# Patient Record
Sex: Female | Born: 1970
Health system: Southern US, Community
[De-identification: ages and names within clinical notes are randomized; demographics above are authoritative.]

## PROBLEM LIST (undated history)

## (undated) DIAGNOSIS — E119 Type 2 diabetes mellitus without complications: Secondary | ICD-10-CM

## (undated) DIAGNOSIS — I10 Essential (primary) hypertension: Secondary | ICD-10-CM

## (undated) DIAGNOSIS — F419 Anxiety disorder, unspecified: Secondary | ICD-10-CM

## (undated) DIAGNOSIS — J3089 Other allergic rhinitis: Secondary | ICD-10-CM

## (undated) HISTORY — PX: ENDOMETRIAL ABLATION: SHX621

## (undated) HISTORY — PX: TUBAL LIGATION: SHX77

---

## 1998-07-13 ENCOUNTER — Inpatient Hospital Stay (HOSPITAL_COMMUNITY): Admission: AD | Admit: 1998-07-13 | Discharge: 1998-07-13 | Payer: Self-pay | Admitting: Obstetrics & Gynecology

## 1998-07-15 ENCOUNTER — Inpatient Hospital Stay (HOSPITAL_COMMUNITY): Admission: AD | Admit: 1998-07-15 | Discharge: 1998-07-15 | Payer: Self-pay | Admitting: Obstetrics

## 1998-07-22 ENCOUNTER — Ambulatory Visit (HOSPITAL_COMMUNITY): Admission: RE | Admit: 1998-07-22 | Discharge: 1998-07-22 | Payer: Self-pay | Admitting: Obstetrics

## 1998-07-31 ENCOUNTER — Ambulatory Visit (HOSPITAL_COMMUNITY): Admission: RE | Admit: 1998-07-31 | Discharge: 1998-07-31 | Payer: Self-pay | Admitting: Obstetrics

## 1998-08-13 ENCOUNTER — Other Ambulatory Visit: Admission: RE | Admit: 1998-08-13 | Discharge: 1998-08-13 | Payer: Self-pay | Admitting: Obstetrics & Gynecology

## 1998-11-16 ENCOUNTER — Other Ambulatory Visit: Admission: RE | Admit: 1998-11-16 | Discharge: 1998-11-16 | Payer: Self-pay | Admitting: Obstetrics & Gynecology

## 1998-12-21 ENCOUNTER — Emergency Department (HOSPITAL_COMMUNITY): Admission: EM | Admit: 1998-12-21 | Discharge: 1998-12-21 | Payer: Self-pay | Admitting: Emergency Medicine

## 1998-12-21 ENCOUNTER — Encounter: Payer: Self-pay | Admitting: Emergency Medicine

## 1999-01-15 ENCOUNTER — Other Ambulatory Visit: Admission: RE | Admit: 1999-01-15 | Discharge: 1999-01-15 | Payer: Self-pay | Admitting: *Deleted

## 1999-03-03 ENCOUNTER — Ambulatory Visit (HOSPITAL_COMMUNITY): Admission: RE | Admit: 1999-03-03 | Discharge: 1999-03-03 | Payer: Self-pay | Admitting: *Deleted

## 2000-05-01 ENCOUNTER — Encounter: Admission: RE | Admit: 2000-05-01 | Discharge: 2000-05-26 | Payer: Self-pay | Admitting: *Deleted

## 2003-09-19 ENCOUNTER — Ambulatory Visit (HOSPITAL_COMMUNITY): Admission: RE | Admit: 2003-09-19 | Discharge: 2003-09-19 | Payer: Self-pay | Admitting: *Deleted

## 2004-10-22 ENCOUNTER — Emergency Department (HOSPITAL_COMMUNITY): Admission: EM | Admit: 2004-10-22 | Discharge: 2004-10-22 | Payer: Self-pay | Admitting: Family Medicine

## 2007-07-15 ENCOUNTER — Emergency Department (HOSPITAL_COMMUNITY): Admission: EM | Admit: 2007-07-15 | Discharge: 2007-07-15 | Payer: Self-pay | Admitting: Emergency Medicine

## 2007-08-15 ENCOUNTER — Emergency Department (HOSPITAL_COMMUNITY): Admission: EM | Admit: 2007-08-15 | Discharge: 2007-08-15 | Payer: Self-pay | Admitting: Family Medicine

## 2007-09-10 ENCOUNTER — Emergency Department (HOSPITAL_COMMUNITY): Admission: EM | Admit: 2007-09-10 | Discharge: 2007-09-10 | Payer: Self-pay | Admitting: Family Medicine

## 2007-10-30 ENCOUNTER — Encounter: Admission: RE | Admit: 2007-10-30 | Discharge: 2007-10-30 | Payer: Self-pay | Admitting: Specialist

## 2008-07-14 ENCOUNTER — Encounter: Admission: RE | Admit: 2008-07-14 | Discharge: 2008-07-14 | Payer: Self-pay | Admitting: Obstetrics and Gynecology

## 2010-06-17 ENCOUNTER — Encounter: Admission: RE | Admit: 2010-06-17 | Discharge: 2010-06-17 | Payer: Self-pay | Admitting: Family Medicine

## 2010-12-17 NOTE — Op Note (Signed)
Tracey Reed, Tracey Reed                      ACCOUNT NO.:  [id number]   MEDICAL RECORD NO.:  [id number]                   PATIENT TYPE:  AMB   LOCATION:  SDC                                  FACILITY:  [facility]   PHYSICIAN:  Tracie Harrier, M.D.              DATE OF BIRTH:  06-26-1971   DATE OF PROCEDURE:  09/19/2003  DATE OF DISCHARGE:                                 OPERATIVE REPORT   PREOPERATIVE DIAGNOSIS:  Abnormal uterine bleeding.   POSTOPERATIVE DIAGNOSIS:  Abnormal uterine bleeding.   PROCEDURE:  Endometrial ablation by Novasure device.   SURGEON:  Tracie Harrier, M.D.   ANESTHESIA:  General.   ESTIMATED BLOOD LOSS:  Less than 20 mL.   COMPLICATIONS:  None.   DESCRIPTION OF PROCEDURE:  The patient was taken to the operating room,  where general anesthetic was administered.  The patient was placed on the  operating table in the dorsal lithotomy position.  The perineum and vagina  were prepped and draped in the usual sterile fashion with Betadine and  sterile drapes.  A red rubber catheter was used to empty the bladder.  A  speculum was placed and the cervix was grasped with a single-tooth  tenaculum.  A paracervical block was then placed in the 3 and 9 o'clock  positions with 1% lidocaine.  Next, the cervix was serially dilated until a  #25 Pratt dilator fit easily into the anterior cervical os.  Next the  Novasure device was introduced into the endometrial cavity and a 59-second  burn was undercarried per company specifications.  All vaginal instruments  were then removed.  The patient tolerated this procedure well.  There were  no perioperative complications.  The patient was then awakened and taken to  the recovery room in good condition.                                               Tracie Harrier, M.D.    REG/MEDQ  D:  09/19/2003  T:  09/20/2003  Job:  [id number]

## 2010-12-17 NOTE — H&P (Signed)
NAME:  Tracey Reed, Tracey Reed                      ACCOUNT NO.:  [id number]   MEDICAL RECORD NO.:  [id number]                   PATIENT TYPE:  AMB   LOCATION:  SDC                                  FACILITY:  [facility]   PHYSICIAN:  Tracie Harrier, M.D.              DATE OF BIRTH:  September 24, 1970   DATE OF ADMISSION:  DATE OF DISCHARGE:                                HISTORY & PHYSICAL   HISTORY OF PRESENT ILLNESS:  Ms. Tracey Reed is a 40 year old female gravida 1  para 1 status post laparoscopic bilateral tubal ligation in the past.  The  patient is having irregular uterine bleeding which is quite worrisome to  her.  She has undergone evaluation for this.  Ultrasound showed only one  small intramural fibroid.  Different treatment options were reviewed with  her including conservative medical management versus aggressive surgical  treatment.  She has requested endometrial ablation.  She also questioned  whether to proceed with vaginal hysterectomy.  We will attempt endometrial  ablation to see if this regulates her bleeding satisfactorily.   MEDICAL HISTORY:  1. History of chlamydia - remote.  2. History of cervical dysplasia.   SURGICAL HISTORY:  1. In 1994 - LEEP procedure.  2. Laparoscopic bilateral tubal ligation - 2000.   OBSTETRICAL HISTORY:  Normal spontaneous vaginal delivery x1 at term.   CURRENT MEDICATIONS:  None.   ALLERGIES:  None known.   PHYSICAL EXAMINATION:  VITAL SIGNS:  Stable, blood pressure 100/60.  GENERAL:  She is a well-developed, well-nourished female in no acute  distress.  HEENT:  Within normal limits.  NECK:  Supple without adenopathy or thyromegaly.  HEART:  Regular rate and rhythm without murmur, gallop, or rub.  LUNGS:  Clear to auscultation.  BREAST:  Exam done recently in the office was normal.  This is deferred upon  admission.  ABDOMEN:  Soft and benign without masses, tenderness, hernia, or  organomegaly.  EXTREMITIES AND NEUROLOGIC:  Grossly normal.  PELVIC:  Normal external female genitalia.  Vagina and cervix clear.  The  uterus is small, nontender, mobile, the adnexa clear.   ADMITTING DIAGNOSES:  1. Abnormal uterine bleeding.  2. Small intramural fibroid.  3. Status post bilateral tubal ligation.   PLAN:  NovaSure endometrial ablation.   DISCUSSION:  The risks and benefits of this procedure discussed with the  patient.  The risks of bleeding, infection, risk of injury to surrounding  organs was reviewed.  Also, various alternative therapies reviewed with the  patient at length.                                               Tracie Harrier, M.D.    REG/MEDQ  D:  09/18/2003  T:  09/18/2003  Job:  [id number]

## 2011-11-01 ENCOUNTER — Ambulatory Visit (INDEPENDENT_AMBULATORY_CARE_PROVIDER_SITE_OTHER): Payer: 59 | Admitting: Family Medicine

## 2011-11-01 VITALS — BP 134/82 | HR 69 | Temp 98.9°F | Resp 18 | Ht 64.5 in | Wt 284.0 lb

## 2011-11-01 DIAGNOSIS — F419 Anxiety disorder, unspecified: Secondary | ICD-10-CM

## 2011-11-01 DIAGNOSIS — F411 Generalized anxiety disorder: Secondary | ICD-10-CM

## 2011-11-01 DIAGNOSIS — M62838 Other muscle spasm: Secondary | ICD-10-CM

## 2011-11-01 DIAGNOSIS — I10 Essential (primary) hypertension: Secondary | ICD-10-CM

## 2011-11-01 LAB — COMPREHENSIVE METABOLIC PANEL
ALT: 18 U/L (ref 0–35)
AST: 25 U/L (ref 0–37)
Albumin: 4 g/dL (ref 3.5–5.2)
Alkaline Phosphatase: 56 U/L (ref 39–117)
BUN: 11 mg/dL (ref 6–23)
CO2: 23 mEq/L (ref 19–32)
Calcium: 9.7 mg/dL (ref 8.4–10.5)
Chloride: 102 mEq/L (ref 96–112)
Creat: 0.85 mg/dL (ref 0.50–1.10)
Glucose, Bld: 128 mg/dL — ABNORMAL HIGH (ref 70–99)
Potassium: 3.7 mEq/L (ref 3.5–5.3)
Sodium: 138 mEq/L (ref 135–145)
Total Bilirubin: 0.4 mg/dL (ref 0.3–1.2)
Total Protein: 7.6 g/dL (ref 6.0–8.3)

## 2011-11-01 LAB — POCT CBC
Granulocyte percent: 51.9 %G (ref 37–80)
HCT, POC: 39.7 % (ref 37.7–47.9)
Hemoglobin: 13.4 g/dL (ref 12.2–16.2)
Lymph, poc: 5.1 — AB (ref 0.6–3.4)
MCH, POC: 27.3 pg (ref 27–31.2)
MCHC: 33.8 g/dL (ref 31.8–35.4)
MCV: 81.1 fL (ref 80–97)
MID (cbc): 0.5 (ref 0–0.9)
MPV: 7.8 fL (ref 0–99.8)
POC Granulocyte: 6.1 (ref 2–6.9)
POC LYMPH PERCENT: 43.5 %L (ref 10–50)
POC MID %: 4.5 %M (ref 0–12)
Platelet Count, POC: 485 10*3/uL — AB (ref 142–424)
RBC: 4.9 M/uL (ref 4.04–5.48)
RDW, POC: 16.3 %
WBC: 11.7 10*3/uL — AB (ref 4.6–10.2)

## 2011-11-01 MED ORDER — CYCLOBENZAPRINE HCL 10 MG PO TABS: 10.0000 mg | ORAL_TABLET | Freq: Two times a day (BID) | ORAL | Status: DC | PRN

## 2011-11-01 MED ORDER — CLONAZEPAM 0.5 MG PO TABS: 0.5000 mg | ORAL_TABLET | Freq: Two times a day (BID) | ORAL | Status: DC | PRN

## 2011-11-01 MED ORDER — ATENOLOL 25 MG PO TABS: 25.0000 mg | ORAL_TABLET | Freq: Every day | ORAL | Status: DC

## 2011-11-01 NOTE — Progress Notes (Signed)
  Patient Name: Tracey Reed Date of Birth: 09-07-1970 Medical Record Number: [id number] Gender: female Date of Encounter: 11/01/2011  History of Present Illness:  Tracey Reed is a 40 y.o. very pleasant female patient who presents with the following:  Here today for medication refill. She uses klonopin 0.5 mg, usually #2 about 3 times weekly.  She does feel that her anxiety may be getting a little bit worse.  She has some panic symptoms as well.  Also uses flexeril rarely for muscle spasm.   She is not exercising but knows that she should start.  She also needs RF of her atenolol  History of BTL  There is no problem list on file for this patient.  No past medical history on file. No past surgical history on file. History  Substance Use Topics  . Smoking status: Never Smoker   . Smokeless tobacco: Not on file  . Alcohol Use: Not on file   No family history on file. No Known Allergies  Medication list has been reviewed and updated.  Review of Systems: As per HPI- otherwise negative.   Physical Examination: Filed Vitals:   11/01/11 1218  BP: 134/82  Pulse: 69  Temp: 98.9 F (37.2 C)  TempSrc: Oral  Resp: 18  Height: 5' 4.5" (1.638 m)  Weight: 284 lb (128.822 kg)    Body mass index is 48.00 kg/(m^2).  GEN: WDWN, NAD, Non-toxic, A & O x 3, obese HEENT: Atraumatic, Normocephalic. Neck supple. No masses, No LAD. Ears and Nose: No external deformity. CV: RRR, No M/G/R. No JVD. No thrill. No extra heart sounds. PULM: CTA B, no wheezes, crackles, rhonchi. No retractions. No resp. distress. No accessory muscle use EXTR: No c/c/e NEURO Normal gait.  PSYCH: Normally interactive. Conversant. Not depressed or anxious appearing.  Calm demeanor.    Assessment and Plan: 1. Anxiety  clonazePAM (KLONOPIN) 0.5 MG tablet, TSH  2. Hypertension  atenolol (TENORMIN) 25 MG tablet, POCT CBC, Comprehensive metabolic panel, TSH  3. Muscle spasm  cyclobenzaprine  (FLEXERIL) 10 MG tablet   Refilled medications as above.  See pt instructions for more details.  She will let us know if she is worse- otherwise further follow- up pending labs

## 2011-11-01 NOTE — Patient Instructions (Signed)
Work on exercising- try a 30 minute walk a few times a week.  Also, consider seeing a counselor for your anxiety.  Let us know if this is getting worse or not improving.

## 2011-11-02 ENCOUNTER — Encounter: Payer: Self-pay | Admitting: Family Medicine

## 2011-11-02 LAB — TSH: TSH: 0.606 u[IU]/mL (ref 0.350–4.500)

## 2011-11-15 ENCOUNTER — Other Ambulatory Visit: Payer: Self-pay | Admitting: Family Medicine

## 2011-11-15 DIAGNOSIS — Z1231 Encounter for screening mammogram for malignant neoplasm of breast: Secondary | ICD-10-CM

## 2011-11-16 ENCOUNTER — Ambulatory Visit
Admission: RE | Admit: 2011-11-16 | Discharge: 2011-11-16 | Disposition: A | Discharge status: Home / Self Care | Payer: BC Managed Care – PPO | Source: Ambulatory Visit | Attending: Family Medicine | Admitting: Family Medicine

## 2011-11-16 ENCOUNTER — Telehealth: Payer: Self-pay

## 2011-11-16 DIAGNOSIS — Z1231 Encounter for screening mammogram for malignant neoplasm of breast: Secondary | ICD-10-CM

## 2011-11-16 NOTE — Telephone Encounter (Signed)
Pt calling to make Dr Milus Glazier aware that she had her breast exam done at the breast center and they will be sending results to him, pt would like for him to call her once he as examined the results.

## 2012-01-02 ENCOUNTER — Other Ambulatory Visit: Payer: Self-pay | Admitting: Family Medicine

## 2012-01-03 ENCOUNTER — Other Ambulatory Visit: Payer: Self-pay | Admitting: *Deleted

## 2012-01-03 DIAGNOSIS — F419 Anxiety disorder, unspecified: Secondary | ICD-10-CM

## 2012-01-03 MED ORDER — CLONAZEPAM 0.5 MG PO TABS: 0.5000 mg | ORAL_TABLET | Freq: Two times a day (BID) | ORAL | Status: DC | PRN

## 2012-01-03 MED ORDER — OMEPRAZOLE 20 MG PO CPDR: 20.0000 mg | DELAYED_RELEASE_CAPSULE | Freq: Every day | ORAL | Status: DC

## 2012-02-04 ENCOUNTER — Other Ambulatory Visit: Payer: Self-pay | Admitting: Physician Assistant

## 2012-02-05 ENCOUNTER — Other Ambulatory Visit: Payer: Self-pay | Admitting: Physician Assistant

## 2012-02-05 DIAGNOSIS — F419 Anxiety disorder, unspecified: Secondary | ICD-10-CM

## 2012-02-05 MED ORDER — CLONAZEPAM 0.5 MG PO TABS: 0.5000 mg | ORAL_TABLET | Freq: Two times a day (BID) | ORAL | Status: DC | PRN

## 2012-02-07 ENCOUNTER — Other Ambulatory Visit: Payer: Self-pay | Admitting: Physician Assistant

## 2012-02-14 ENCOUNTER — Telehealth: Payer: Self-pay

## 2012-02-14 NOTE — Telephone Encounter (Signed)
Pt would like to see if Dr can do a constant refill on her clonazapam since she takes it on a regular basis since she has to call it in to pharmacy every month [phone]

## 2012-02-15 NOTE — Telephone Encounter (Signed)
Is this something that can be done?

## 2012-02-15 NOTE — Telephone Encounter (Signed)
LMOM to call back

## 2012-02-15 NOTE — Telephone Encounter (Signed)
Likely this is not the case sine we PA's do the refills and we cannot write for refills of it.

## 2012-02-19 NOTE — Telephone Encounter (Signed)
Patient notified

## 2012-02-29 ENCOUNTER — Telehealth: Payer: Self-pay

## 2012-02-29 DIAGNOSIS — F419 Anxiety disorder, unspecified: Secondary | ICD-10-CM

## 2012-02-29 NOTE — Telephone Encounter (Signed)
If patient has already picked up prescription, ok to call in an addition #30 to equal a total of 1 month supply. If she has not picked it up, may change to #60.

## 2012-02-29 NOTE — Telephone Encounter (Signed)
The patient called to request change in her Clonazepam Rx.  She stated that the orginial rx was written for 2 x daily totaling 60 pills and then Kennedy Bucker PAC changed it to a 15 day supply of 30 pills and she was wondering why she changed the rx from a 30 day supply to a 15 day supply.  Please call the patient at [phone].

## 2012-02-29 NOTE — Telephone Encounter (Signed)
It looks like Tracey Reed might have written RF for #30 instead of #60 in error on 02/05/12 because there is no note on RF stating pt needs OV. Can we RF for 30 days now (#60)?

## 2012-03-01 MED ORDER — CLONAZEPAM 0.5 MG PO TABS: 0.5000 mg | ORAL_TABLET | Freq: Two times a day (BID) | ORAL | Status: DC | PRN

## 2012-03-01 NOTE — Telephone Encounter (Signed)
Called in the rest of her Rx and called her to advise

## 2012-03-17 ENCOUNTER — Other Ambulatory Visit: Payer: Self-pay | Admitting: Physician Assistant

## 2012-03-22 ENCOUNTER — Other Ambulatory Visit: Payer: Self-pay | Admitting: Family Medicine

## 2012-03-22 DIAGNOSIS — F419 Anxiety disorder, unspecified: Secondary | ICD-10-CM

## 2012-03-22 MED ORDER — CLONAZEPAM 0.5 MG PO TABS: 0.5000 mg | ORAL_TABLET | Freq: Two times a day (BID) | ORAL | Status: DC | PRN

## 2012-04-10 ENCOUNTER — Other Ambulatory Visit: Payer: Self-pay | Admitting: Family Medicine

## 2012-04-10 DIAGNOSIS — F419 Anxiety disorder, unspecified: Secondary | ICD-10-CM

## 2012-04-10 MED ORDER — CLONAZEPAM 0.5 MG PO TABS: 0.5000 mg | ORAL_TABLET | Freq: Two times a day (BID) | ORAL | Status: DC | PRN

## 2012-05-05 ENCOUNTER — Ambulatory Visit (INDEPENDENT_AMBULATORY_CARE_PROVIDER_SITE_OTHER): Payer: 59 | Admitting: Family Medicine

## 2012-05-05 VITALS — BP 136/89 | HR 59 | Temp 97.9°F | Resp 18 | Ht 65.5 in | Wt 263.8 lb

## 2012-05-05 DIAGNOSIS — F419 Anxiety disorder, unspecified: Secondary | ICD-10-CM

## 2012-05-05 DIAGNOSIS — K219 Gastro-esophageal reflux disease without esophagitis: Secondary | ICD-10-CM

## 2012-05-05 DIAGNOSIS — F411 Generalized anxiety disorder: Secondary | ICD-10-CM

## 2012-05-05 MED ORDER — CLONAZEPAM 1 MG PO TABS: 1.0000 mg | ORAL_TABLET | Freq: Every day | ORAL | Status: DC | PRN

## 2012-05-05 MED ORDER — OMEPRAZOLE 20 MG PO CPDR: 20.0000 mg | DELAYED_RELEASE_CAPSULE | Freq: Every day | ORAL | Status: DC

## 2012-05-05 NOTE — Progress Notes (Signed)
41 yo woman with chronic anxiety and epigastric distress (chronic with negative GI workup) and some extremity paresthesias  Objective:  NAD Chest:  Clear Heart: reg, no murmur Abdomen:  Soft, nontender, no masses  Assessment:  Mild hypokalemia, chronic IBS, anxiety not well controlled   1. Anxiety  clonazePAM (KLONOPIN) 1 MG tablet  2. GERD (gastroesophageal reflux disease)  omeprazole (PRILOSEC) 20 MG capsule   Probiotics.  Cut HCTZ in half.  Call if paresthesias persist.

## 2012-05-06 ENCOUNTER — Telehealth: Payer: Self-pay

## 2012-05-06 NOTE — Telephone Encounter (Signed)
p 

## 2012-07-02 ENCOUNTER — Other Ambulatory Visit: Payer: Self-pay | Admitting: *Deleted

## 2012-07-02 ENCOUNTER — Telehealth: Payer: Self-pay | Admitting: *Deleted

## 2012-07-02 MED ORDER — HYDROCHLOROTHIAZIDE 25 MG PO TABS: 25.0000 mg | ORAL_TABLET | Freq: Every day | ORAL | Status: DC

## 2012-08-07 ENCOUNTER — Other Ambulatory Visit: Payer: Self-pay | Admitting: Physician Assistant

## 2012-09-27 ENCOUNTER — Other Ambulatory Visit: Payer: Self-pay | Admitting: Physician Assistant

## 2012-11-02 ENCOUNTER — Encounter: Payer: Self-pay | Admitting: Family Medicine

## 2012-11-02 ENCOUNTER — Ambulatory Visit (INDEPENDENT_AMBULATORY_CARE_PROVIDER_SITE_OTHER): Payer: 59 | Admitting: Family Medicine

## 2012-11-02 VITALS — BP 132/60 | HR 50 | Temp 98.1°F | Resp 16 | Ht 65.25 in | Wt 261.2 lb

## 2012-11-02 DIAGNOSIS — F419 Anxiety disorder, unspecified: Secondary | ICD-10-CM

## 2012-11-02 DIAGNOSIS — I10 Essential (primary) hypertension: Secondary | ICD-10-CM | POA: Insufficient documentation

## 2012-11-02 DIAGNOSIS — R7309 Other abnormal glucose: Secondary | ICD-10-CM

## 2012-11-02 DIAGNOSIS — F411 Generalized anxiety disorder: Secondary | ICD-10-CM | POA: Insufficient documentation

## 2012-11-02 LAB — COMPREHENSIVE METABOLIC PANEL
ALT: 25 U/L (ref 0–35)
AST: 24 U/L (ref 0–37)
Albumin: 4.2 g/dL (ref 3.5–5.2)
Alkaline Phosphatase: 55 U/L (ref 39–117)
BUN: 15 mg/dL (ref 6–23)
CO2: 30 mEq/L (ref 19–32)
Calcium: 9.7 mg/dL (ref 8.4–10.5)
Chloride: 100 mEq/L (ref 96–112)
Creat: 1.02 mg/dL (ref 0.50–1.10)
Glucose, Bld: 93 mg/dL (ref 70–99)
Potassium: 3.9 mEq/L (ref 3.5–5.3)
Sodium: 138 mEq/L (ref 135–145)
Total Bilirubin: 0.4 mg/dL (ref 0.3–1.2)
Total Protein: 7.8 g/dL (ref 6.0–8.3)

## 2012-11-02 LAB — TSH: TSH: 0.716 u[IU]/mL (ref 0.350–4.500)

## 2012-11-02 LAB — POCT CBC
Granulocyte percent: 42.8 %G (ref 37–80)
HCT, POC: 41.1 % (ref 37.7–47.9)
Hemoglobin: 13.2 g/dL (ref 12.2–16.2)
Lymph, poc: 4.1 — AB (ref 0.6–3.4)
MCH, POC: 27 pg (ref 27–31.2)
MCHC: 32.1 g/dL (ref 31.8–35.4)
MCV: 84.1 fL (ref 80–97)
MID (cbc): 0.5 (ref 0–0.9)
MPV: 7.7 fL (ref 0–99.8)
POC Granulocyte: 3.5 (ref 2–6.9)
POC LYMPH PERCENT: 50.6 %L — AB (ref 10–50)
POC MID %: 6.6 %M (ref 0–12)
Platelet Count, POC: 482 10*3/uL — AB (ref 142–424)
RBC: 4.89 M/uL (ref 4.04–5.48)
RDW, POC: 15.7 %
WBC: 8.1 10*3/uL (ref 4.6–10.2)

## 2012-11-02 LAB — POCT GLYCOSYLATED HEMOGLOBIN (HGB A1C): Hemoglobin A1C: 6.2

## 2012-11-02 MED ORDER — HYDROCHLOROTHIAZIDE 25 MG PO TABS: ORAL_TABLET | ORAL | Status: DC

## 2012-11-02 MED ORDER — ATENOLOL 25 MG PO TABS: 25.0000 mg | ORAL_TABLET | Freq: Every day | ORAL | Status: DC

## 2012-11-02 MED ORDER — CLONAZEPAM 1 MG PO TABS: 1.0000 mg | ORAL_TABLET | Freq: Every day | ORAL | Status: DC | PRN

## 2012-11-02 NOTE — Patient Instructions (Addendum)
Your A1c test shows that you have pre- diabetes.  Please work hard on diet and exercise changes with a goal of weight loss.  This will help to prevent the development of diabetes.  If you have not had a complete physical in a while please do this in the next few months. You also need to have a FASTING cholesterol panel some time soon.  I have refilled your HTN medications for a year, and your clonazepam for 6 months.

## 2012-11-02 NOTE — Progress Notes (Signed)
[facility] [address], [place] [address] [phone]  Date:  11/02/2012   Name:  Tracey Reed   DOB:  Dec 09, 1970   MRN:  [id number]  PCP:  Elvina Sidle, MD    Chief Complaint: Medication Refill   History of Present Illness:  Tracey Reed is a 42 y.o. very pleasant female patient who presents with the following:  She needs medication refills today.  She has noted body aches off an on for the last 2 years.  Wonders if she may have fibromyalgia and if she can have a "blood test" for this conditoin.  She is not fasting today.  At last year's labs her glucose was noted to be 128- she would like to follow- this up today as well  S/p ablation and a BTL.  She no longer has periods.  She takes clonazepam once a day for anxiety- needs refill of this medication and also her anti- HTN medications.  She has lost 19 lbs recetnly, and is working on losing more weight.        There is no problem list on file for this patient.   History reviewed. No pertinent past medical history.  History reviewed. No pertinent past surgical history.  History  Substance Use Topics  . Smoking status: Never Smoker   . Smokeless tobacco: Not on file  . Alcohol Use: Not on file    Family History  Problem Relation Age of Onset  . Hypertension Mother   . Cancer Maternal Grandmother     No Known Allergies  Medication list has been reviewed and updated.  Current Outpatient Prescriptions on File Prior to Visit  Medication Sig Dispense Refill  . atenolol (TENORMIN) 25 MG tablet Take 1 tablet (25 mg total) by mouth daily.  90 tablet  3  . clonazePAM (KLONOPIN) 1 MG tablet Take 1 tablet (1 mg total) by mouth daily as needed for anxiety.  90 tablet  1  . cyclobenzaprine (FLEXERIL) 10 MG tablet Take 1 tablet (10 mg total) by mouth 2 (two) times daily as needed.  30 tablet  1  . hydrochlorothiazide (HYDRODIURIL) 25 MG tablet TAKE 1 TABLET (25 MG TOTAL) BY  MOUTH DAILY.  30 tablet  0  . omeprazole (PRILOSEC) 20 MG capsule Take 1 capsule (20 mg total) by mouth daily.  90 capsule  3  . clonazePAM (KLONOPIN) 0.5 MG tablet TAKE 1 TABLET BY MOUTH TWICE A DAY AS NEEDED  30 tablet  0  . meclizine (ANTIVERT) 25 MG tablet Take 25 mg by mouth at bedtime as needed.       No current facility-administered medications on file prior to visit.    Review of Systems:  As per HPI- otherwise negative.    Physical Examination: Filed Vitals:   11/02/12 0958  BP: 132/60  Pulse: 50  Temp: 98.1 F (36.7 C)  Resp: 16   Filed Vitals:   11/02/12 0958  Height: 5' 5.25" (1.657 m)  Weight: 261 lb 3.2 oz (118.48 kg)   Body mass index is 43.15 kg/(m^2). Ideal Body Weight: Weight in (lb) to have BMI = 25: 151.1  GEN: WDWN, NAD, Non-toxic, A & O x 3, obese HEENT: Atraumatic, Normocephalic. Neck supple. No masses, No LAD. Ears and Nose: No external deformity. CV: RRR, No M/G/R. No JVD. No thrill. No extra heart sounds. PULM: CTA B, no wheezes, crackles, rhonchi. No retractions. No resp. distress. No accessory muscle use. EXTR: No c/c/e NEURO Normal  gait.  PSYCH: Normally interactive. Conversant. Not depressed or anxious appearing.  Calm demeanor.   Results for orders placed in visit on 11/02/12  POCT CBC      Result Value Range   WBC 8.1  4.6 - 10.2 K/uL   Lymph, poc 4.1 (*) 0.6 - 3.4   POC LYMPH PERCENT 50.6 (*) 10 - 50 %L   MID (cbc) 0.5  0 - 0.9   POC MID % 6.6  0 - 12 %M   POC Granulocyte 3.5  2 - 6.9   Granulocyte percent 42.8  37 - 80 %G   RBC 4.89  4.04 - 5.48 M/uL   Hemoglobin 13.2  12.2 - 16.2 g/dL   HCT, POC 16.1  09.6 - 47.9 %   MCV 84.1  80 - 97 fL   MCH, POC 27.0  27 - 31.2 pg   MCHC 32.1  31.8 - 35.4 g/dL   RDW, POC 04.5     Platelet Count, POC 482 (*) 142 - 424 K/uL   MPV 7.7  0 - 99.8 fL  POCT GLYCOSYLATED HEMOGLOBIN (HGB A1C)      Result Value Range   Hemoglobin A1C 6.2      Assessment and Plan: Unspecified essential  hypertension - Plan: POCT CBC, Comprehensive metabolic panel, TSH  Anxiety state, unspecified  Morbid obesity - Plan: TSH  Elevated glucose - Plan: POCT glycosylated hemoglobin (Hb A1C)  Anxiety - Plan: clonazePAM (KLONOPIN) 1 MG tablet  Hypertension - Plan: hydrochlorothiazide (HYDRODIURIL) 25 MG tablet, atenolol (TENORMIN) 25 MG tablet  Refilled her HTN medications, BP control ok, await labs.   Refilled clonazepam to use prn anxiety Counseled that her A1c shows pre- diabetes; continue to work on weight loss.  Will plan further follow- up pending labs.  After pt left I had a change to do research and discovered that there is a lab test for FBM-however it is not widely used due to expense.  Called her back and discussed- will send her information about FBM (UTD detailed pt information) and if she suspect that she has this condition I can refer her to rheumatology if she likes      Signed Abbe Amsterdam, MD

## 2012-11-07 ENCOUNTER — Telehealth: Payer: Self-pay

## 2012-11-07 NOTE — Telephone Encounter (Signed)
Patient called wanting to talk to [facility] about her medication hydrochlorothiazide (HYDRODIURIL) 25 MG tablet She has questions about refill; she was told by [facility] that she would have a year-supply refill and 90 day supply/ and now she does not have that.  Patient says she would like that fixed.  Please leave voicemail, if no answer.  Best number is: [phone].

## 2012-11-07 NOTE — Telephone Encounter (Signed)
She does have #90 with 3 refills. Im confused. Called pt LMOM to CB

## 2012-11-08 ENCOUNTER — Other Ambulatory Visit: Payer: Self-pay | Admitting: Radiology

## 2012-11-08 NOTE — Telephone Encounter (Signed)
Called again. Left message for her to advise.

## 2012-11-08 NOTE — Telephone Encounter (Signed)
Patient called, she was able to get her Rx, but only for 30 day supply due to insurance changes.

## 2013-01-23 ENCOUNTER — Other Ambulatory Visit: Payer: Self-pay

## 2013-01-23 DIAGNOSIS — Z1231 Encounter for screening mammogram for malignant neoplasm of breast: Secondary | ICD-10-CM

## 2013-02-05 ENCOUNTER — Ambulatory Visit
Admission: RE | Admit: 2013-02-05 | Discharge: 2013-02-05 | Disposition: A | Discharge status: Home / Self Care | Payer: 59 | Source: Ambulatory Visit

## 2013-02-05 DIAGNOSIS — Z1231 Encounter for screening mammogram for malignant neoplasm of breast: Secondary | ICD-10-CM

## 2013-04-08 ENCOUNTER — Emergency Department (HOSPITAL_COMMUNITY): Payer: 59

## 2013-04-08 ENCOUNTER — Encounter (HOSPITAL_COMMUNITY): Payer: Self-pay | Admitting: *Deleted

## 2013-04-08 ENCOUNTER — Emergency Department (HOSPITAL_COMMUNITY)
Admission: EM | Admit: 2013-04-08 | Discharge: 2013-04-08 | Disposition: A | Discharge status: Home / Self Care | Payer: 59 | Attending: Emergency Medicine | Admitting: Emergency Medicine

## 2013-04-08 DIAGNOSIS — IMO0002 Reserved for concepts with insufficient information to code with codable children: Secondary | ICD-10-CM | POA: Insufficient documentation

## 2013-04-08 DIAGNOSIS — F411 Generalized anxiety disorder: Secondary | ICD-10-CM | POA: Insufficient documentation

## 2013-04-08 DIAGNOSIS — S0093XA Contusion of unspecified part of head, initial encounter: Secondary | ICD-10-CM

## 2013-04-08 DIAGNOSIS — T7491XA Unspecified adult maltreatment, confirmed, initial encounter: Secondary | ICD-10-CM | POA: Insufficient documentation

## 2013-04-08 DIAGNOSIS — H1130 Conjunctival hemorrhage, unspecified eye: Secondary | ICD-10-CM | POA: Insufficient documentation

## 2013-04-08 DIAGNOSIS — Z79899 Other long term (current) drug therapy: Secondary | ICD-10-CM | POA: Insufficient documentation

## 2013-04-08 DIAGNOSIS — H1131 Conjunctival hemorrhage, right eye: Secondary | ICD-10-CM

## 2013-04-08 DIAGNOSIS — S0083XA Contusion of other part of head, initial encounter: Secondary | ICD-10-CM

## 2013-04-08 DIAGNOSIS — S0990XA Unspecified injury of head, initial encounter: Secondary | ICD-10-CM | POA: Insufficient documentation

## 2013-04-08 DIAGNOSIS — T7492XA Unspecified child maltreatment, confirmed, initial encounter: Secondary | ICD-10-CM | POA: Insufficient documentation

## 2013-04-08 DIAGNOSIS — I10 Essential (primary) hypertension: Secondary | ICD-10-CM | POA: Insufficient documentation

## 2013-04-08 DIAGNOSIS — S0003XA Contusion of scalp, initial encounter: Secondary | ICD-10-CM | POA: Insufficient documentation

## 2013-04-08 HISTORY — DX: Anxiety disorder, unspecified: F41.9

## 2013-04-08 HISTORY — DX: Essential (primary) hypertension: I10

## 2013-04-08 MED ORDER — IBUPROFEN 600 MG PO TABS: 600.0000 mg | ORAL_TABLET | Freq: Four times a day (QID) | ORAL | Status: DC | PRN

## 2013-04-08 MED ORDER — TRAMADOL HCL 50 MG PO TABS: 50.0000 mg | ORAL_TABLET | Freq: Four times a day (QID) | ORAL | Status: DC | PRN
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
## 2013-04-08 NOTE — ED Provider Notes (Deleted)
Medical screening examination/treatment/procedure(s) were conducted as a shared visit with non-physician practitioner(s) and myself.  I personally evaluated the patient during the encounter  Vinnie Bobst, MD 04/08/13 2255 
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
## 2013-04-18 NOTE — ED Provider Notes (Signed)
I assumed care of this patient, completed the history, ROS, PE and MDM.   Brandt Loosen, MD 04/18/13 2219
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
## 2013-05-19 ENCOUNTER — Encounter: Payer: Self-pay | Admitting: Family Medicine

## 2013-05-19 ENCOUNTER — Ambulatory Visit (INDEPENDENT_AMBULATORY_CARE_PROVIDER_SITE_OTHER): Payer: 59 | Admitting: Family Medicine

## 2013-05-19 VITALS — BP 128/80 | HR 60 | Temp 98.1°F | Resp 16 | Ht 64.0 in | Wt 273.2 lb

## 2013-05-19 DIAGNOSIS — M62838 Other muscle spasm: Secondary | ICD-10-CM

## 2013-05-19 DIAGNOSIS — I10 Essential (primary) hypertension: Secondary | ICD-10-CM

## 2013-05-19 DIAGNOSIS — K219 Gastro-esophageal reflux disease without esophagitis: Secondary | ICD-10-CM

## 2013-05-19 DIAGNOSIS — F411 Generalized anxiety disorder: Secondary | ICD-10-CM

## 2013-05-19 DIAGNOSIS — F419 Anxiety disorder, unspecified: Secondary | ICD-10-CM

## 2013-05-19 MED ORDER — CYCLOBENZAPRINE HCL 10 MG PO TABS: 10.0000 mg | ORAL_TABLET | Freq: Two times a day (BID) | ORAL | Status: DC | PRN

## 2013-05-19 MED ORDER — ATENOLOL 25 MG PO TABS: 25.0000 mg | ORAL_TABLET | Freq: Every day | ORAL | Status: DC

## 2013-05-19 MED ORDER — CLONAZEPAM 1 MG PO TABS: 1.0000 mg | ORAL_TABLET | Freq: Every day | ORAL | Status: DC | PRN

## 2013-05-19 MED ORDER — HYDROCHLOROTHIAZIDE 25 MG PO TABS: 25.0000 mg | ORAL_TABLET | Freq: Every day | ORAL | Status: DC

## 2013-05-19 MED ORDER — CITALOPRAM HYDROBROMIDE 20 MG PO TABS: 20.0000 mg | ORAL_TABLET | Freq: Every day | ORAL | Status: DC

## 2013-05-19 MED ORDER — OMEPRAZOLE 20 MG PO CPDR: 20.0000 mg | DELAYED_RELEASE_CAPSULE | Freq: Every day | ORAL | Status: DC

## 2013-05-19 NOTE — Progress Notes (Signed)
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 She has chronic anxiety which has gotten worse. Initially the clonazepam and done very well but lately he's finding that the anxiety is causing some tremulousness.  Patient's not having any problem with her reflux at this point  Muscle spasms are infrequent in the cyclobenzaprine when necessary is working  No chest pain, headache, palpitations, shortness of breath, extremity edema, no rash, nausea, vomiting, diarrhea, visual disturbance  Objective: No acute distress HEENT: Unremarkable Neck: Supple no adenopathy Chest: Clear Heart: Regular no murmur Extremities: Normal gait and range of motion of upper extremities Skin: Clear  Anxiety - Plan: citalopram (CELEXA) 20 MG tablet, clonazePAM (KLONOPIN) 1 MG tablet  Hypertension - Plan: atenolol (TENORMIN) 25 MG tablet, hydrochlorothiazide (HYDRODIURIL) 25 MG tablet  Muscle spasm - Plan: cyclobenzaprine (FLEXERIL) 10 MG tablet  GERD (gastroesophageal reflux disease) - Plan: omeprazole (PRILOSEC) 20 MG capsule  Signed, Elvina Sidle, MD

## 2013-05-22 ENCOUNTER — Encounter: Payer: Self-pay | Admitting: *Deleted

## 2013-11-24 ENCOUNTER — Other Ambulatory Visit: Payer: Self-pay | Admitting: Family Medicine

## 2013-11-25 ENCOUNTER — Other Ambulatory Visit: Payer: Self-pay | Admitting: Family Medicine

## 2013-11-26 NOTE — Telephone Encounter (Signed)
Called in.

## 2013-12-01 ENCOUNTER — Ambulatory Visit (INDEPENDENT_AMBULATORY_CARE_PROVIDER_SITE_OTHER): Payer: 59 | Admitting: Family Medicine

## 2013-12-01 VITALS — BP 146/80 | HR 59 | Resp 16 | Ht 64.0 in | Wt 276.6 lb

## 2013-12-01 DIAGNOSIS — F411 Generalized anxiety disorder: Secondary | ICD-10-CM

## 2013-12-01 DIAGNOSIS — F419 Anxiety disorder, unspecified: Secondary | ICD-10-CM

## 2013-12-01 DIAGNOSIS — I10 Essential (primary) hypertension: Secondary | ICD-10-CM

## 2013-12-01 DIAGNOSIS — M549 Dorsalgia, unspecified: Secondary | ICD-10-CM

## 2013-12-01 DIAGNOSIS — K219 Gastro-esophageal reflux disease without esophagitis: Secondary | ICD-10-CM

## 2013-12-01 MED ORDER — CLONAZEPAM 1 MG PO TABS: 1.0000 mg | ORAL_TABLET | Freq: Every day | ORAL | Status: DC

## 2013-12-01 MED ORDER — HYDROCHLOROTHIAZIDE 25 MG PO TABS: 25.0000 mg | ORAL_TABLET | Freq: Every day | ORAL | Status: DC

## 2013-12-01 MED ORDER — VITAMIN D (ERGOCALCIFEROL) 1.25 MG (50000 UNIT) PO CAPS: 50000.0000 [IU] | ORAL_CAPSULE | ORAL | Status: DC

## 2013-12-01 MED ORDER — ATENOLOL 25 MG PO TABS: 25.0000 mg | ORAL_TABLET | Freq: Every day | ORAL | Status: DC

## 2013-12-01 MED ORDER — OMEPRAZOLE 20 MG PO CPDR: 20.0000 mg | DELAYED_RELEASE_CAPSULE | Freq: Every day | ORAL | Status: DC

## 2013-12-01 MED ORDER — CITALOPRAM HYDROBROMIDE 20 MG PO TABS: 20.0000 mg | ORAL_TABLET | Freq: Every day | ORAL | Status: DC

## 2013-12-01 NOTE — Progress Notes (Signed)
° °  Subjective:    Patient ID: Tracey Reed, female    DOB: 12/24/1970, 43 y.o.   MRN: [id number]  HPI  This chart was scribed for Robyn Haber, MD by Thea Alken, ED Scribe. This patient was seen in room 8 and the patient's care was started at 11:16 AM.  HPI Comments: Tracey Reed is a 43 y.o. female who presents to the [facility] here regarding a medication refill. Pt reports that she is transferring all medication over to [facility] and that she has had some difficulties doing so. Pt reports work is going well. Pt reports that she is due for pap smear this year. Pt denies taking birth control.  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    Past Medical History  Diagnosis Date   Hypertension    Anxiety    No Known Allergies Prior to Admission medications   Medication Sig Start Date End Date Taking? Authorizing Provider  atenolol (TENORMIN) 25 MG tablet Take 1 tablet (25 mg total) by mouth daily. 05/19/13  Yes Robyn Haber, MD  citalopram (CELEXA) 20 MG tablet Take 1 tablet (20 mg total) by mouth daily. 05/19/13  Yes Robyn Haber, MD  clonazePAM (KLONOPIN) 1 MG tablet TAKE 1 TABLET BY MOUTH AS NEEDED FOR ANXIETY   Yes Robyn Haber, MD  cyclobenzaprine (FLEXERIL) 10 MG tablet Take 1 tablet (10 mg total) by mouth 2 (two) times daily as needed. 05/19/13  Yes Robyn Haber, MD  hydrochlorothiazide (HYDRODIURIL) 25 MG tablet TAKE 1 TABLET BY MOUTH DAILY 11/25/13  Yes Robyn Haber, MD  lubiprostone (AMITIZA) 24 MCG capsule Take 24 mcg by mouth daily with breakfast.   Yes Historical Provider, MD  omeprazole (PRILOSEC) 20 MG capsule Take 1 capsule (20 mg total) by mouth daily. 05/19/13  Yes Robyn Haber, MD  Vitamin D, Ergocalciferol, (DRISDOL) 50000 UNITS CAPS capsule Take 50,000 Units by mouth every 7 (seven) days.   Yes Historical Provider, MD  ibuprofen (ADVIL,MOTRIN) 600 MG tablet Take 1 tablet (600 mg total)  by mouth every 6 (six) hours as needed for pain. 04/08/13   Elyn Peers, MD  traMADol (ULTRAM) 50 MG tablet Take 1 tablet (50 mg total) by mouth every 6 (six) hours as needed for pain. 04/08/13   Elyn Peers, MD   Review of Systems     Objective:   Physical Exam  Nursing note and vitals reviewed. Constitutional: She is oriented to person, place, and time. She appears well-developed and well-nourished. No distress.  HENT:  Head: Normocephalic and atraumatic.  Eyes: EOM are normal.  Neck: Neck supple.  Cardiovascular: Normal rate.   Pulmonary/Chest: Effort normal.  Musculoskeletal: Normal range of motion.  Neurological: She is alert and oriented to person, place, and time.  Skin: Skin is warm and dry.  Psychiatric: She has a normal mood and affect. Her behavior is normal.      Assessment & Plan:     Anxiety - Plan: citalopram (CELEXA) 20 MG tablet, clonazePAM (KLONOPIN) 1 MG tablet  Hypertension - Plan: atenolol (TENORMIN) 25 MG tablet, hydrochlorothiazide (HYDRODIURIL) 25 MG tablet  GERD (gastroesophageal reflux disease) - Plan: omeprazole (PRILOSEC) 20 MG capsule  Back pain - Plan: Vitamin D, Ergocalciferol, (DRISDOL) 50000 UNITS CAPS capsule  Signed, Robyn Haber, MD

## 2013-12-01 NOTE — Patient Instructions (Signed)
Recheck 6 months

## 2013-12-26 ENCOUNTER — Other Ambulatory Visit: Payer: Self-pay

## 2013-12-26 DIAGNOSIS — Z1231 Encounter for screening mammogram for malignant neoplasm of breast: Secondary | ICD-10-CM

## 2014-01-22 ENCOUNTER — Other Ambulatory Visit: Payer: Self-pay | Admitting: Family Medicine

## 2014-02-05 ENCOUNTER — Ambulatory Visit (INDEPENDENT_AMBULATORY_CARE_PROVIDER_SITE_OTHER): Payer: 59 | Admitting: Family Medicine

## 2014-02-05 VITALS — BP 124/76 | HR 70 | Temp 98.0°F | Resp 18 | Ht 65.0 in | Wt 277.0 lb

## 2014-02-05 DIAGNOSIS — R609 Edema, unspecified: Secondary | ICD-10-CM

## 2014-02-05 DIAGNOSIS — I10 Essential (primary) hypertension: Secondary | ICD-10-CM

## 2014-02-05 MED ORDER — FUROSEMIDE 20 MG PO TABS: 20.0000 mg | ORAL_TABLET | Freq: Every day | ORAL | Status: DC

## 2014-02-05 NOTE — Patient Instructions (Signed)
Drink plenty of water  Continue to try to take dietary caution to avoid excessive salt  Continue regular exercise  Discontinue the hydrochlorothiazide  Began furosemide (Lasix) 20 mg one daily in the morning.  Return if not improving  Continue to try to work on decreasing calorie intake by:  Decreasing portions at mealtime  Cutting out snacks  Cutting out any liquid calories  Being extremely cautious when eating out

## 2014-02-05 NOTE — Progress Notes (Signed)
Subjective: Patient is here for 2 things. She would like a prescription for Belviq.  Secondly she has been feeling puffy and doesn't think her diuretic is working well. She doesn't seem to be putting as much fluid as she use to.  She walks regularly. Tries to watch what she eats. She eats a lot of salads.  Objective: Morbidly obese young lady in no major distress. Throat clear. Neck supple without thyromegaly. Chest clear. Heart regular without murmurs. Abdomen soft without masses tenderness. No hiatal pitting edema.  Assessment: Morbid obesity Swelling sensation  Plan: I did agree to take her off the hydrochlorothiazide and prone furosemide 20 mg daily to see if that will help the fluid situation at all. Going up on hydrochlorothiazide probably would not give much incremental benefit. We could push the furosemide to 40 mg if needed. Did urge her to watch salt intake and increase activity. I a was going to order labs that she says she had labs done at work recently and they were all normal. I only gave her medicine and off for 3 months. She is to return at that time and should have blood sugars she's checked before continuing medications.  I explained to her that I would not prescribe theBelviq. She said she would go elsewhere see if she can find someone who would prescribe the Belviq since I declined prescribing diet medications.

## 2014-02-06 ENCOUNTER — Ambulatory Visit: Payer: 59

## 2014-02-10 ENCOUNTER — Ambulatory Visit
Admission: RE | Admit: 2014-02-10 | Discharge: 2014-02-10 | Disposition: A | Discharge status: Home / Self Care | Payer: 59 | Source: Ambulatory Visit

## 2014-02-10 DIAGNOSIS — Z1231 Encounter for screening mammogram for malignant neoplasm of breast: Secondary | ICD-10-CM

## 2014-05-02 ENCOUNTER — Ambulatory Visit (INDEPENDENT_AMBULATORY_CARE_PROVIDER_SITE_OTHER): Payer: 59 | Admitting: Endocrinology

## 2014-05-02 ENCOUNTER — Encounter: Payer: Self-pay | Admitting: Endocrinology

## 2014-05-02 VITALS — BP 124/70 | HR 67 | Temp 98.2°F | Wt 276.0 lb

## 2014-05-02 DIAGNOSIS — R490 Dysphonia: Secondary | ICD-10-CM | POA: Insufficient documentation

## 2014-05-02 NOTE — Patient Instructions (Addendum)
Please see an ear-nose-throat specialist.  you will receive a phone call, about a day and time for an appointment. most of the time, a "lumpy thyroid" will eventually become overactive.  this is usually a slow process, happening over the span of many years. You should have the thyroid blood test checked at least every year.  I would be happy to do this, or I'm sure Dr Joseph Art would be happy to also.

## 2014-05-02 NOTE — Progress Notes (Signed)
Subjective:    Patient ID: Tracey Reed, female    DOB: 04/20/1971, 43 y.o.   MRN: [id number]  HPI Pt reports she was dx'ed with hyperthyroidism in 2015.  He has never been on therapy for this.  He has never had XRT to the anterior neck, or thyroid surgery.  she does not consume kelp or any other prescribed or non-prescribed thyroid medication.  He has never been on amiodarone.  She has slight tremor of the hands, and assoc hoarseness.  Past Medical History  Diagnosis Date  . Hypertension   . Anxiety     No past surgical history on file.  History   Social History  . Marital Status: Married    Spouse Name: N/A    Number of Children: N/A  . Years of Education: N/A   Occupational History  . Not on file.   Social History Main Topics  . Smoking status: Never Smoker   . Smokeless tobacco: Not on file  . Alcohol Use: Yes  . Drug Use: Not on file  . Sexual Activity: Not on file   Other Topics Concern  . Not on file   Social History Narrative  . No narrative on file    Current Outpatient Prescriptions on File Prior to Visit  Medication Sig Dispense Refill  . atenolol (TENORMIN) 25 MG tablet Take 1 tablet (25 mg total) by mouth daily.  90 tablet  3  . citalopram (CELEXA) 20 MG tablet Take 1 tablet (20 mg total) by mouth daily.  90 tablet  3  . clonazePAM (KLONOPIN) 1 MG tablet Take 1 tablet (1 mg total) by mouth daily.  90 tablet  1  . cyclobenzaprine (FLEXERIL) 10 MG tablet TAKE 1 TABLET BY MOUTH TWICE DAILY AS NEEDED  30 tablet  3  . furosemide (LASIX) 20 MG tablet Take 1 tablet (20 mg total) by mouth daily.  30 tablet  2  . ibuprofen (ADVIL,MOTRIN) 600 MG tablet Take 1 tablet (600 mg total) by mouth every 6 (six) hours as needed for pain.  20 tablet  0  . lubiprostone (AMITIZA) 24 MCG capsule Take 24 mcg by mouth daily with breakfast.      . omeprazole (PRILOSEC) 20 MG capsule Take 1 capsule (20 mg total) by mouth daily.  90 capsule  3  . Vitamin D,  Ergocalciferol, (DRISDOL) 50000 UNITS CAPS capsule Take 1 capsule (50,000 Units total) by mouth every 7 (seven) days.  30 capsule  2   No current facility-administered medications on file prior to visit.    Allergies  Allergen Reactions  . Clindamycin/Lincomycin Rash    Family History  Problem Relation Age of Onset  . Hypertension Mother   . Cancer Maternal Grandmother   . Thyroid disease Neg Hx     BP 124/70  Pulse 67  Temp(Src) 98.2 F (36.8 C) (Oral)  Wt 276 lb (125.193 kg)  SpO2 97%      Review of Systems denies weight loss, double vision, palpitations, sob, diarrhea, polyuria, muscle weakness, numbness, easy bruising, and rhinorrhea.  She has intermittent numbness of all 4's.  She has headache, anxiety, and excessive diaphoresis.  She has no menses since endometrial ablation.      Objective:   Physical Exam VS: see vs page GEN: no distress HEAD: head: no deformity eyes: no periorbital swelling, no proptosis external nose and ears are normal mouth: no lesion seen NECK: thyroid seems enlarged with irreg surface, but i can't tell details.  CHEST WALL: no deformity LUNGS:  Clear to auscultation CV: reg rate and rhythm, no murmur ABD: abdomen is soft, nontender.  no hepatosplenomegaly.  not distended.  no hernia.   MUSCULOSKELETAL: muscle bulk and strength are grossly normal.  no obvious joint swelling.  gait is normal and steady EXTEMITIES: no deformity.   no edema PULSES: dorsalis pedis intact bilat.   NEURO:  cn 2-12 grossly intact.   readily moves all 4's.  sensation is intact to touch on all 4's.  SKIN:  Normal texture and temperature.  No rash or suspicious lesion is visible.   NODES:  None palpable at the neck PSYCH: alert, well-oriented.  Does not appear anxious nor depressed.  i have reviewed the following outside records: Office notes  Outside labs: TSH:0.9  Radiol: small multinodular goiter     Assessment & Plan:  Multinodular goiter, new to  me.  She is at risk for the eventual development of hyperthyroidism. Neck sxs, uncertain etiology   Patient is advised the following: Patient Instructions  Please see an ear-nose-throat specialist.  you will receive a phone call, about a day and time for an appointment. most of the time, a "lumpy thyroid" will eventually become overactive.  this is usually a slow process, happening over the span of many years. You should have the thyroid blood test checked at least every year.  I would be happy to do this, or I'm sure Dr Joseph Art would be happy to also.

## 2014-06-23 ENCOUNTER — Ambulatory Visit (INDEPENDENT_AMBULATORY_CARE_PROVIDER_SITE_OTHER): Payer: 59 | Admitting: Internal Medicine

## 2014-06-23 ENCOUNTER — Other Ambulatory Visit: Payer: Self-pay | Admitting: Family Medicine

## 2014-06-23 VITALS — BP 124/82 | HR 61 | Temp 97.8°F | Resp 16 | Ht 65.0 in | Wt 281.2 lb

## 2014-06-23 DIAGNOSIS — J3489 Other specified disorders of nose and nasal sinuses: Secondary | ICD-10-CM

## 2014-06-23 DIAGNOSIS — H9201 Otalgia, right ear: Secondary | ICD-10-CM

## 2014-06-23 MED ORDER — AMOXICILLIN 500 MG PO CAPS: 1000.0000 mg | ORAL_CAPSULE | Freq: Two times a day (BID) | ORAL | Status: DC

## 2014-06-23 MED ORDER — IBUPROFEN 600 MG PO TABS: 600.0000 mg | ORAL_TABLET | Freq: Three times a day (TID) | ORAL | Status: DC | PRN

## 2014-06-23 NOTE — Telephone Encounter (Signed)
Faxed

## 2014-06-23 NOTE — Progress Notes (Signed)
   Subjective:    Patient ID: Tracey Reed, female    DOB: 04-21-1971, 43 y.o.   MRN: [id number]  HPI A 43 year old female is here with complaints of right ear pain.  She states the pain is more considered to be inner started about two days ago.   She denies having any drainage in her ear.  Patients states she has an history of having ear, nose and throat issues.  She states she has not being seeing any ENT doctor recently.  She states that she also has an issue with post-nasal drip that has been recurrent for years and believes the ear pain may be in relation with it.  Patient states she has recently been having some congestion, and cough.   She states her throat hurts some; however, it does not hurt to eat or swallow.  She also adds that her jaw line (lymph nodes) is tender .  She adds that her ear sometimes pop.  Patient states that she has been around her daughter whom was seen at [facility] recently for an sinus infection.  Patient states that she has been having some chills but denies having any fevers.       Review of Systems     Objective:   Physical Exam  Constitutional: She is oriented to person, place, and time. She appears well-developed.  HENT:  Head: Normocephalic and atraumatic.  Right Ear: External ear normal.  Left Ear: External ear normal.  Nose: Nose normal.  Mouth/Throat: Oropharynx is clear and moist.  Eyes: Conjunctivae and EOM are normal. Pupils are equal, round, and reactive to light.  Neck: Normal range of motion.  Cardiovascular: Normal rate.   Pulmonary/Chest: Effort normal.  Neurological: She is alert and oriented to person, place, and time. She exhibits normal muscle tone. Coordination normal.  Skin: No rash noted.  Psychiatric: She has a normal mood and affect.          Assessment & Plan:  Otalgia/Exposed to infection Only use amoxil if high fever or copious green discharge

## 2014-06-23 NOTE — Patient Instructions (Signed)
Otalgia  The most common reason for this in children is an infection of the middle ear. Pain from the middle ear is usually caused by a build-up of fluid and pressure behind the eardrum. Pain from an earache can be sharp, dull, or burning. The pain may be temporary or constant. The middle ear is connected to the nasal passages by a short narrow tube called the Eustachian tube. The Eustachian tube allows fluid to drain out of the middle ear, and helps keep the pressure in your ear equalized.  CAUSES   A cold or allergy can block the Eustachian tube with inflammation and the build-up of secretions. This is especially likely in small children, because their Eustachian tube is shorter and more horizontal. When the Eustachian tube closes, the normal flow of fluid from the middle ear is stopped. Fluid can accumulate and cause stuffiness, pain, hearing loss, and an ear infection if germs start growing in this area.  SYMPTOMS   The symptoms of an ear infection may include fever, ear pain, fussiness, increased crying, and irritability. Many children will have temporary and minor hearing loss during and right after an ear infection. Permanent hearing loss is rare, but the risk increases the more infections a child has. Other causes of ear pain include retained water in the outer ear canal from swimming and bathing.  Ear pain in adults is less likely to be from an ear infection. Ear pain may be referred from other locations. Referred pain may be from the joint between your jaw and the skull. It may also come from a tooth problem or problems in the neck. Other causes of ear pain include:   A foreign body in the ear.   Outer ear infection.   Sinus infections.   Impacted ear wax.   Ear injury.   Arthritis of the jaw or TMJ problems.   Middle ear infection.   Tooth infections.   Sore throat with pain to the ears.  DIAGNOSIS   Your caregiver can usually make the diagnosis by examining you. Sometimes other special studies,  including x-rays and lab work may be necessary.  TREATMENT    If antibiotics were prescribed, use them as directed and finish them even if you or your child's symptoms seem to be improved.   Sometimes PE tubes are needed in children. These are little plastic tubes which are put into the eardrum during a simple surgical procedure. They allow fluid to drain easier and allow the pressure in the middle ear to equalize. This helps relieve the ear pain caused by pressure changes.  HOME CARE INSTRUCTIONS    Only take over-the-counter or prescription medicines for pain, discomfort, or fever as directed by your caregiver. DO NOT GIVE CHILDREN ASPIRIN because of the association of Reye's Syndrome in children taking aspirin.   Use a cold pack applied to the outer ear for 15-20 minutes, 03-04 times per day or as needed may reduce pain. Do not apply ice directly to the skin. You may cause frost bite.   Over-the-counter ear drops used as directed may be effective. Your caregiver may sometimes prescribe ear drops.   Resting in an upright position may help reduce pressure in the middle ear and relieve pain.   Ear pain caused by rapidly descending from high altitudes can be relieved by swallowing or chewing gum. Allowing infants to suck on a bottle during airplane travel can help.   Do not smoke in the house or near children. If you are   unable to quit smoking, smoke outside.   Control allergies.  SEEK IMMEDIATE MEDICAL CARE IF:    You or your child are becoming sicker.   Pain or fever relief is not obtained with medicine.   You or your child's symptoms (pain, fever, or irritability) do not improve within 24 to 48 hours or as instructed.   Severe pain suddenly stops hurting. This may indicate a ruptured eardrum.   You or your children develop new problems such as severe headaches, stiff neck, difficulty swallowing, or swelling of the face or around the ear.  Document Released: 03/04/2004 Document Revised: 10/10/2011  Document Reviewed: 07/09/2008  [employer] Patient Information 2015 [employer]. This information is not intended to replace advice given to you by your health care provider. Make sure you discuss any questions you have with your health care provider.

## 2014-07-29 ENCOUNTER — Ambulatory Visit: Payer: 59

## 2014-10-02 ENCOUNTER — Ambulatory Visit (INDEPENDENT_AMBULATORY_CARE_PROVIDER_SITE_OTHER): Payer: 59 | Admitting: Family Medicine

## 2014-10-02 ENCOUNTER — Ambulatory Visit (INDEPENDENT_AMBULATORY_CARE_PROVIDER_SITE_OTHER): Payer: 59

## 2014-10-02 VITALS — BP 122/78 | HR 63 | Temp 97.8°F | Resp 16 | Ht 64.0 in | Wt 272.0 lb

## 2014-10-02 DIAGNOSIS — R600 Localized edema: Secondary | ICD-10-CM | POA: Diagnosis not present

## 2014-10-02 DIAGNOSIS — Z13220 Encounter for screening for lipoid disorders: Secondary | ICD-10-CM

## 2014-10-02 DIAGNOSIS — Z130 Encounter for screening for diseases of the blood and blood-forming organs and certain disorders involving the immune mechanism: Secondary | ICD-10-CM

## 2014-10-02 DIAGNOSIS — E559 Vitamin D deficiency, unspecified: Secondary | ICD-10-CM

## 2014-10-02 DIAGNOSIS — E042 Nontoxic multinodular goiter: Secondary | ICD-10-CM | POA: Diagnosis not present

## 2014-10-02 DIAGNOSIS — M25561 Pain in right knee: Secondary | ICD-10-CM

## 2014-10-02 MED ORDER — HYDROCHLOROTHIAZIDE 25 MG PO TABS: 25.0000 mg | ORAL_TABLET | Freq: Every day | ORAL | Status: DC

## 2014-10-02 MED ORDER — MELOXICAM 7.5 MG PO TABS: 7.5000 mg | ORAL_TABLET | Freq: Two times a day (BID) | ORAL | Status: DC | PRN

## 2014-10-02 NOTE — Progress Notes (Signed)
Chief Complaint:  Chief Complaint  Patient presents with  . Leg Swelling    Both Legs, x 1 month    HPI: Tracey Reed is a 44 y.o. female who is here for overall edema and also right knee pain in the posterior knee cap and laso anterior aspect She has had ot for years and off and on for 20 years, she injured it 1995, she had her knee over flexed during a tussle with her daughters father injured it pretty significantly. She had swelling and knee pain for very long time. She denies any knee instability when she walks, does have problems when she goes up and down the stairs. She denies any weakness, numbness or tingling. She has been trying to lose weight, was previously on both feet. Currently she has prescriptions for contraceptive. Contrave-has not been on it for awhile.-Patient has used belviq. She still has been off for some time now. She was on Amitiza for IBS she has tried over-the-counter medications without any relief. It is rated as moderate to severe pain. She has problems with weightbearing. It is in both legs and knees but primarily in the right knee.,   Wt Readings from Last 3 Encounters:  10/02/14 272 lb (123.378 kg)  06/23/14 281 lb 3.2 oz (127.551 kg)  05/02/14 276 lb (125.193 kg)     Past Medical History  Diagnosis Date  . Hypertension   . Anxiety    Past Surgical History  Procedure Laterality Date  . Endometrial ablation     History   Social History  . Marital Status: Married    Spouse Name: N/A  . Number of Children: N/A  . Years of Education: N/A   Social History Main Topics  . Smoking status: Never Smoker   . Smokeless tobacco: Not on file  . Alcohol Use: Yes  . Drug Use: Not on file  . Sexual Activity: Not on file   Other Topics Concern  . None   Social History Narrative   Family History  Problem Relation Age of Onset  . Hypertension Mother   . Cancer Maternal Grandmother   . Thyroid disease Neg Hx    Allergies    Allergen Reactions  . Clindamycin/Lincomycin Rash   Prior to Admission medications   Medication Sig Start Date End Date Taking? Authorizing Provider  amoxicillin (AMOXIL) 500 MG capsule Take 2 capsules (1,000 mg total) by mouth 2 (two) times daily. Patient not taking: Reported on 10/02/2014 06/23/14   Orma Flaming, MD  atenolol (TENORMIN) 25 MG tablet Take 1 tablet (25 mg total) by mouth daily. 12/01/13   Robyn Haber, MD  citalopram (CELEXA) 20 MG tablet Take 1 tablet (20 mg total) by mouth daily. 12/01/13   Robyn Haber, MD  clonazePAM (KLONOPIN) 1 MG tablet TAKE 1 TABLET BY MOUTH ONCE DAILY 06/23/14   Robyn Haber, MD  cyclobenzaprine (FLEXERIL) 10 MG tablet TAKE 1 TABLET BY MOUTH TWICE DAILY AS NEEDED    Robyn Haber, MD  furosemide (LASIX) 20 MG tablet Take 1 tablet (20 mg total) by mouth daily. Patient not taking: Reported on 10/02/2014 02/05/14   Posey Boyer, MD  ibuprofen (ADVIL,MOTRIN) 600 MG tablet Take 1 tablet (600 mg total) by mouth every 6 (six) hours as needed for pain. Patient not taking: Reported on 10/02/2014 04/08/13   Elyn Peers, MD  ibuprofen (ADVIL,MOTRIN) 600 MG tablet Take 1 tablet (600 mg total) by mouth every 8 (eight) hours as needed. 06/23/14  Orma Flaming, MD  lubiprostone (AMITIZA) 24 MCG capsule Take 24 mcg by mouth daily with breakfast.    Historical Provider, MD  omeprazole (PRILOSEC) 20 MG capsule Take 1 capsule (20 mg total) by mouth daily. 12/01/13   Robyn Haber, MD  Vitamin D, Ergocalciferol, (DRISDOL) 50000 UNITS CAPS capsule Take 1 capsule (50,000 Units total) by mouth every 7 (seven) days. 12/01/13   Robyn Haber, MD     ROS: The patient denies fevers, chills, night sweats, unintentional weight loss, chest pain, palpitations, wheezing, dyspnea on exertion, nausea, vomiting, abdominal pain, dysuria, hematuria, melena, numbness, weakness, or tingling.   All other systems have been reviewed and were otherwise negative with the exception of those  mentioned in the HPI and as above.    PHYSICAL EXAM: Filed Vitals:   10/02/14 1728  BP: 122/78  Pulse: 63  Temp: 97.8 F (36.6 C)  Resp: 16   Filed Vitals:   10/02/14 1728  Height: 5\' 4"  (1.626 m)  Weight: 272 lb (123.378 kg)   Body mass index is 46.67 kg/(m^2).  General: Alert, no acute distress,  Obese African-American female HEENT:  Normocephalic, atraumatic, oropharynx patent. EOMI, PERRLA Cardiovascular:  Regular rate and rhythm, no rubs murmurs or gallops.  No Carotid bruits, radial pulse intact. +pedal edema.  Respiratory: Clear to auscultation bilaterally.  No wheezes, rales, or rhonchi.  No cyanosis, no use of accessory musculature GI: No organomegaly, abdomen is soft and non-tender, positive bowel sounds.  No masses. Skin: No rashes. Neurologic: Facial musculature symmetric. Psychiatric: Patient is appropriate throughout our interaction. Lymphatic: No cervical lymphadenopathy Musculoskeletal: Gait intact. No deformity, Neg ballotment Diffuse tenderness Decrease AROM, full PROM 5/5 strength, 2/2 DTRs ankle ,  Neg Lachman, Neg medial jt line tenderness, neg McMurray L spine normal ROM,  Straight leg negative Hip normal ROM     LABS: Results for orders placed or performed in visit on 11/02/12  Comprehensive metabolic panel  Result Value Ref Range   Sodium 138 135 - 145 mEq/L   Potassium 3.9 3.5 - 5.3 mEq/L   Chloride 100 96 - 112 mEq/L   CO2 30 19 - 32 mEq/L   Glucose, Bld 93 70 - 99 mg/dL   BUN 15 6 - 23 mg/dL   Creat 1.02 0.50 - 1.10 mg/dL   Total Bilirubin 0.4 0.3 - 1.2 mg/dL   Alkaline Phosphatase 55 39 - 117 U/L   AST 24 0 - 37 U/L   ALT 25 0 - 35 U/L   Total Protein 7.8 6.0 - 8.3 g/dL   Albumin 4.2 3.5 - 5.2 g/dL   Calcium 9.7 8.4 - 10.5 mg/dL  TSH  Result Value Ref Range   TSH 0.716 0.350 - 4.500 uIU/mL  POCT CBC  Result Value Ref Range   WBC 8.1 4.6 - 10.2 K/uL   Lymph, poc 4.1 (A) 0.6 - 3.4   POC LYMPH PERCENT 50.6 (A) 10 - 50 %L   MID  (cbc) 0.5 0 - 0.9   POC MID % 6.6 0 - 12 %M   POC Granulocyte 3.5 2 - 6.9   Granulocyte percent 42.8 37 - 80 %G   RBC 4.89 4.04 - 5.48 M/uL   Hemoglobin 13.2 12.2 - 16.2 g/dL   HCT, POC 41.1 37.7 - 47.9 %   MCV 84.1 80 - 97 fL   MCH, POC 27.0 27 - 31.2 pg   MCHC 32.1 31.8 - 35.4 g/dL   RDW, POC 15.7 %   Platelet  Count, POC 482 (A) 142 - 424 K/uL   MPV 7.7 0 - 99.8 fL  POCT glycosylated hemoglobin (Hb A1C)  Result Value Ref Range   Hemoglobin A1C 6.2      EKG/XRAY:   Primary read interpreted by Dr. Marin Comment at [facility]. Neg fractur or disclocation + arthritis   ASSESSMENT/PLAN: Encounter Diagnoses  Name Primary?  . Bilateral edema of lower extremity Yes  . Knee pain, right   . Multinodular goiter   . Screening for hyperlipidemia   . Screening for deficiency anemia   . Vitamin D deficiency    This is a pleasant morbidly obese African-American female who is here for swelling of her lower extremities but primarily for her right knee pain. She also would like some labs done since she has had labs done for her vitamin D deficiency, her thyroid goiter, in her cholesterol.  Pending labs include TSH, CBC, CMP, vitamin D level.  She was prescribed Mobic, was told to discontinue her ibuprofen. She was also told to use her Flexeril which she has had. I am not planning to prescribe her any narcotics since she has constipation predominant IBS and is on Amitiza for it Follow-up when necessary. We also discussed different options for bedside contave weight loss . Prescribed hydrochlorothiazide 25 mg by mouth daily which she takes already, she may take the next 2 days as needed in the evening for worsening lower extremity edema after standing on her feet for prolonged periods. CMP pending.  Gross sideeffects, risk and benefits, and alternatives of medications d/w patient. Patient is aware that all medications have potential sideeffects and we are unable to predict every sideeffect or drug-drug  interaction that may occur.   Nicci Vaughan, [place], DO 10/02/2014 7:30 PM

## 2014-10-03 LAB — COMPLETE METABOLIC PANEL WITH GFR
ALT: 23 U/L (ref 0–35)
AST: 22 U/L (ref 0–37)
Albumin: 4 g/dL (ref 3.5–5.2)
Alkaline Phosphatase: 65 U/L (ref 39–117)
BUN: 14 mg/dL (ref 6–23)
CO2: 32 mEq/L (ref 19–32)
Calcium: 9.8 mg/dL (ref 8.4–10.5)
Chloride: 98 mEq/L (ref 96–112)
Creat: 0.93 mg/dL (ref 0.50–1.10)
GFR, Est African American: 86 mL/min
GFR, Est Non African American: 75 mL/min
Glucose, Bld: 93 mg/dL (ref 70–99)
Potassium: 3.6 mEq/L (ref 3.5–5.3)
Sodium: 141 mEq/L (ref 135–145)
Total Bilirubin: 0.3 mg/dL (ref 0.2–1.2)
Total Protein: 7.8 g/dL (ref 6.0–8.3)

## 2014-10-03 LAB — CBC
HCT: 38 % (ref 36.0–46.0)
Hemoglobin: 12.9 g/dL (ref 12.0–15.0)
MCH: 27.2 pg (ref 26.0–34.0)
MCHC: 33.9 g/dL (ref 30.0–36.0)
MCV: 80.2 fL (ref 78.0–100.0)
MPV: 8.6 fL (ref 8.6–12.4)
Platelets: 431 10*3/uL — ABNORMAL HIGH (ref 150–400)
RBC: 4.74 MIL/uL (ref 3.87–5.11)
RDW: 15 % (ref 11.5–15.5)
WBC: 10.1 10*3/uL (ref 4.0–10.5)

## 2014-10-03 LAB — TSH: TSH: 0.109 u[IU]/mL — ABNORMAL LOW (ref 0.350–4.500)

## 2014-10-03 LAB — LIPID PANEL
Cholesterol: 214 mg/dL — ABNORMAL HIGH (ref 0–200)
HDL: 58 mg/dL (ref >=46)
LDL Cholesterol: 143 mg/dL — ABNORMAL HIGH (ref 0–99)
Total CHOL/HDL Ratio: 3.7 Ratio
Triglycerides: 66 mg/dL (ref <150)
VLDL: 13 mg/dL (ref 0–40)

## 2014-10-04 LAB — VITAMIN D 25 HYDROXY (VIT D DEFICIENCY, FRACTURES): Vit D, 25-Hydroxy: 31 ng/mL (ref 30–100)

## 2014-10-07 ENCOUNTER — Telehealth: Payer: Self-pay

## 2014-10-07 DIAGNOSIS — E0590 Thyrotoxicosis, unspecified without thyrotoxic crisis or storm: Secondary | ICD-10-CM

## 2014-10-07 DIAGNOSIS — E049 Nontoxic goiter, unspecified: Secondary | ICD-10-CM

## 2014-10-07 NOTE — Telephone Encounter (Signed)
Patient request to speak with Dr Marin Comment  In regards to her lab results. She stated Dr Marin Comment had contacted her today and she wants to know if she will need to start on any medications. Patients call back number is [phone]

## 2014-10-08 ENCOUNTER — Encounter: Payer: Self-pay | Admitting: Family Medicine

## 2014-10-08 NOTE — Telephone Encounter (Signed)
Spoke with Tracey Reed. Notified Tracey Reed of labs and she will get on MyChart and look at her labs Wants to know if we can refer her to a different Endo. Wants a second opinion. Wasn't happy with Dr. Loanne Drilling Please advise. Thanks

## 2014-10-08 NOTE — Telephone Encounter (Signed)
Hi [name]!   1. Your electrolytes, kidneys, liver, hemoglobin are normal.   2. Your TSH was abnormla, showing you are hyperthyroid so this may expalin some of your symptoms. Your TSH was normal about 6 months ago but due to the goiters it is hyperthyroid now. I suggest you make an appointment with Dr Loanne Drilling and see what he wants to do.   3. Vitamin D level is borderline low. OVer the counter Vitamin D is fine   4. Your cholesterol is high , but I would not put you on meds at this time. If you can do water aerobics, stationary biking for 30 min a day and eat a low fat low carbohydrate diet then this should help   Please follow up as needed and with Dr Loanne Drilling as soon as possible.   Thanks  Dr Marin Comment

## 2014-10-08 NOTE — Telephone Encounter (Signed)
Left message on machine to call back  

## 2014-10-08 NOTE — Telephone Encounter (Signed)
Will refer to Dr Letta Median, if she does not hear in 1 week about appt then need to let us know.

## 2014-10-09 NOTE — Telephone Encounter (Signed)
Notified via mychart.

## 2014-10-20 ENCOUNTER — Ambulatory Visit: Payer: 59 | Admitting: Endocrinology

## 2014-11-27 ENCOUNTER — Ambulatory Visit: Payer: 59 | Admitting: Internal Medicine

## 2014-12-08 ENCOUNTER — Other Ambulatory Visit: Payer: Self-pay | Admitting: Family Medicine

## 2014-12-08 ENCOUNTER — Other Ambulatory Visit: Payer: Self-pay

## 2014-12-08 DIAGNOSIS — K219 Gastro-esophageal reflux disease without esophagitis: Secondary | ICD-10-CM

## 2014-12-08 DIAGNOSIS — I10 Essential (primary) hypertension: Secondary | ICD-10-CM

## 2014-12-08 DIAGNOSIS — F419 Anxiety disorder, unspecified: Secondary | ICD-10-CM

## 2014-12-08 MED ORDER — ATENOLOL 25 MG PO TABS: 25.0000 mg | ORAL_TABLET | Freq: Every day | ORAL | Status: DC

## 2014-12-08 MED ORDER — CITALOPRAM HYDROBROMIDE 20 MG PO TABS: 20.0000 mg | ORAL_TABLET | Freq: Every day | ORAL | Status: DC

## 2014-12-08 MED ORDER — VITAMIN D (ERGOCALCIFEROL) 1.25 MG (50000 UNIT) PO CAPS: 50000.0000 [IU] | ORAL_CAPSULE | ORAL | Status: DC

## 2014-12-08 MED ORDER — OMEPRAZOLE 20 MG PO CPDR: 20.0000 mg | DELAYED_RELEASE_CAPSULE | Freq: Every day | ORAL | Status: DC

## 2014-12-08 MED ORDER — HYDROCHLOROTHIAZIDE 25 MG PO TABS: 25.0000 mg | ORAL_TABLET | Freq: Every day | ORAL | Status: DC

## 2014-12-08 MED ORDER — CLONAZEPAM 1 MG PO TABS: 1.0000 mg | ORAL_TABLET | Freq: Every day | ORAL | Status: DC

## 2014-12-08 NOTE — Telephone Encounter (Addendum)
Pt states she has misplaced all her meds,please call in   [place] phone [phone]

## 2014-12-08 NOTE — Telephone Encounter (Signed)
Called pt to see which medications she needs, I am confused.

## 2014-12-08 NOTE — Telephone Encounter (Signed)
Pt called back requesting Atenolol, Citalopram, Clonaepam, Omeprazole, Vit D, HCTZ. Pleas advise on Clonazepam.

## 2014-12-09 ENCOUNTER — Other Ambulatory Visit: Payer: Self-pay | Admitting: Family Medicine

## 2014-12-09 NOTE — Telephone Encounter (Signed)
Faxed Rx into her pharm.

## 2014-12-18 ENCOUNTER — Encounter: Payer: Self-pay | Admitting: Internal Medicine

## 2014-12-18 ENCOUNTER — Other Ambulatory Visit: Payer: Self-pay | Admitting: Internal Medicine

## 2014-12-18 ENCOUNTER — Ambulatory Visit (INDEPENDENT_AMBULATORY_CARE_PROVIDER_SITE_OTHER): Payer: 59 | Admitting: Internal Medicine

## 2014-12-18 VITALS — BP 132/86 | HR 47 | Temp 97.6°F | Resp 18 | Ht 66.0 in | Wt 289.0 lb

## 2014-12-18 DIAGNOSIS — E042 Nontoxic multinodular goiter: Secondary | ICD-10-CM | POA: Diagnosis not present

## 2014-12-18 DIAGNOSIS — R946 Abnormal results of thyroid function studies: Secondary | ICD-10-CM | POA: Diagnosis not present

## 2014-12-18 DIAGNOSIS — R7989 Other specified abnormal findings of blood chemistry: Secondary | ICD-10-CM

## 2014-12-18 NOTE — Patient Instructions (Signed)
Please stop at the lab.  Please come back for a follow-up appointment in 4 months.   

## 2014-12-18 NOTE — Progress Notes (Addendum)
Patient ID: Tracey Reed, female   DOB: 05-Mar-1971, 44 y.o.   MRN: [id number]   HPI  Tracey Reed is a 44 y.o.-year-old female, referred by her PCP, Dr. Joseph Art, for evaluation for a low TSH and thyroid nodules with neck compression sxs. She has been seen by Dr Loanne Drilling in the past, last visit 05/2014; would like a second opinion.  I reviewed pt's thyroid tests: Lab Results  Component Value Date   TSH 0.109* 10/02/2014   TSH 0.716 11/02/2012   TSH 0.606 11/01/2011    She has several thyroid nodules - thyroid U/S (03/12/2014) - thyroid not enlarged, largest nodules 1.1 cm.  Pt complains of feeling nodules in neck (L side), + hoarseness, + dysphagia (worse) with food/choking with liquids >> started last year; no odynophagia (but dysphagia); + SOB with lying down; she c/o: - + fatigue - + weight gain - + excessive sweating/heat intolerance - + tremors - last year - no increased anxiety - no palpitations - no hyperdefecation; + constipation (IBS) - + thininghair loss  Pt does have a FH of thyroid ds: niece. No FH of thyroid cancer. No h/o radiation tx to head or neck.  No seaweed or kelp, no recent contrast studies. No steroid use. No herbal supplements. No Biotin use. No recent URI.   I reviewed her chart and she also has a history of IBS, HTN, HL, vit D def, anxiety, depression (seasonal).   ROS: Constitutional: See history of present illness Eyes: + blurry vision, no xerophthalmia ENT: no sore throat, see history of present illness Cardiovascular: no CP/SOB/palpitations/+ leg swelling Respiratory: + cough/no SOB/+ wheezing Gastrointestinal: + Heartburn, + N/no V/no D/+ C Musculoskeletal: + muscle/joint aches Skin: no rashes, + itching, hair loss, rash  Neurological: no tremors/numbness/tingling/dizziness, + headache Psychiatric: + depression/+ anxiety + Low libido  Past Medical History  Diagnosis Date  . Hypertension   . Anxiety    Past  Surgical History  Procedure Laterality Date  . Endometrial ablation     tubal ligation  History   Social History  . Marital Status: Married    Spouse Name: N/A  . Number of Children: 1   Occupational History  .  CMA at [employer] .   Social History Main Topics  . Smoking status: Never Smoker   . Smokeless tobacco: Not on file  . Alcohol Use: Yes, socially, mixed drinks, 1-2 drinks every 2 months   . Drug Use: No   Current Outpatient Prescriptions on File Prior to Visit  Medication Sig Dispense Refill  . atenolol (TENORMIN) 25 MG tablet Take 1 tablet (25 mg total) by mouth daily. 90 tablet 3  . atenolol (TENORMIN) 25 MG tablet TAKE 1 TABLET (25 MG TOTAL) BY MOUTH DAILY. 90 tablet 0  . citalopram (CELEXA) 20 MG tablet Take 1 tablet (20 mg total) by mouth daily. 90 tablet 3  . citalopram (CELEXA) 20 MG tablet TAKE 1 TABLET (20 MG TOTAL) BY MOUTH DAILY. 90 tablet 0  . clonazePAM (KLONOPIN) 1 MG tablet Take 1 tablet (1 mg total) by mouth daily. 90 tablet 1  . clonazePAM (KLONOPIN) 1 MG tablet TAKE 1 TABLET BY MOUTH DAILY 90 tablet 1  . cyclobenzaprine (FLEXERIL) 10 MG tablet TAKE 1 TABLET BY MOUTH TWICE DAILY AS NEEDED 30 tablet 3  . hydrochlorothiazide (HYDRODIURIL) 25 MG tablet Take 1 tablet (25 mg total) by mouth daily. 30 tablet 6  . ibuprofen (ADVIL,MOTRIN) 600 MG tablet Take 1 tablet (600 mg total)  by mouth every 8 (eight) hours as needed. 30 tablet 1  . lubiprostone (AMITIZA) 24 MCG capsule Take 24 mcg by mouth daily with breakfast.    . meloxicam (MOBIC) 7.5 MG tablet TAKE 1 TABLET BY MOUTH 2 TIMES DAILY AS NEEDED FOR PAIN. NO OTHER NSAIDS, TAKE WITH FOOD. 60 tablet 0  . omeprazole (PRILOSEC) 20 MG capsule Take 1 capsule (20 mg total) by mouth daily. 90 capsule 3  . omeprazole (PRILOSEC) 20 MG capsule TAKE 1 CAPSULE (20 MG TOTAL) BY MOUTH DAILY. 90 capsule 0  . Vitamin D, Ergocalciferol, (DRISDOL) 50000 UNITS CAPS capsule Take 1 capsule (50,000 Units total) by mouth  every 7 (seven) days. 30 capsule 2  . Vitamin D, Ergocalciferol, (DRISDOL) 50000 UNITS CAPS capsule TAKE 1 CAPSULE (50,000 UNITS TOTAL) BY MOUTH EVERY 7 (SEVEN) DAYS. 30 capsule 2   No current facility-administered medications on file prior to visit.   Allergies  Allergen Reactions  . Clindamycin/Lincomycin Rash   Family History  Problem Relation Age of Onset  . Hypertension Mother   . Cancer Maternal Grandmother   . Thyroid disease Neg Hx    + hypertension in mother, grandmother, aunts  PE: BP 132/86 mmHg  Pulse 47  Temp(Src) 97.6 F (36.4 C) (Oral)  Resp 18  Ht 5\' 6"  (1.676 m)  Wt 289 lb (131.09 kg)  BMI 46.67 kg/m2  SpO2 97% Wt Readings from Last 3 Encounters:  12/18/14 289 lb (131.09 kg)  10/02/14 272 lb (123.378 kg)  06/23/14 281 lb 3.2 oz (127.551 kg)   Constitutional: Obese, in NAD Eyes: PERRLA, EOMI, no exophthalmos, no lid lag, no stare ENT: moist mucous membranes, no thyromegaly, no thyroid bruits, no cervical lymphadenopathy Cardiovascular: RRR, No MRG Respiratory: CTA B Gastrointestinal: abdomen soft, NT, ND, BS+ Musculoskeletal: no deformities, strength intact in all 4 Skin: moist, warm, no rashes Neurological: no tremor with outstretched hands, DTR normal in all 4  ASSESSMENT: 1. Low TSH  2. Multiple thyroid nodules  PLAN:  1. Patient with a recently found low TSH, without thyrotoxic sxs except heat intolerance.  - she does not appear to have exogenous causes for the low TSH.  - We discussed that possible causes of thyrotoxicosis are:  Graves ds   Thyroiditis toxic multinodular goiter/ toxic adenoma (I cannot feel nodules at palpation of her thyroid). - I suggested that we check the TSH, fT3 and fT4 today - If the tests remain abnormal, we may need an uptake and scan to differentiate between the 3 above possible etiologies  - we discussed about possible modalities of treatment for the above conditions, to include methimazole use, radioactive iodine  ablation or (last resort) surgery. - I do not feel that we need to add beta blockers at this time, since she is not tachycardic, anxious, or tremulous  2. Thyroid nodules - I reviewed the report of her thyroid ultrasound obtained at [facility] (no images available) along with the patient. She has several small hypoechoic nodules bilaterally, with the largest one measuring 1.1 cm. The actual size of the thyroid is not large. - We discussed that it is very unlikely that the normal thyroid to cause significant compression on her esophagus, however she does feel neck compression symptoms - I suggested to check the thyroid tests first to see if she needs an uptake and scan. However, if the tests are normal, the next step would be to repeat the thyroid ultrasound to see if she developed an enlarging thyroid cyst that  may compress the esophagus. I also would like to obtain a barium swallow to check for esophageal strictures versus external compression.  We discussed that if the above labs do not show any pathology, I would recommend that she sees ENT. She agrees with this plan.  - time spent with the patient: 40 min, of which >50% was spent in obtaining information about her symptoms, reviewing her previous labs, evaluations, and treatments, counseling her about her condition (please see the discussed topics above), and developing a plan to further investigate it; she had a number of questions which I addressed.  Orders Only on 12/18/2014  Component Date Value Ref Range Status  . TSH 12/18/2014 0.358  0.350 - 4.500 uIU/mL Final  . Free T4 12/18/2014 0.98  0.80 - 1.80 ng/dL Final  . T3, Free 12/18/2014 3.1  2.3 - 4.2 pg/mL Final   Msg sent: Dear Ms Algis Liming The thyroid tests are now normal! We can go ahead scheduling the thyroid ultrasound and barium swallow. You will be called by [place]. Please let me know if they do not call you in the next 5 days. Sincerely, Philemon Kingdom  MD  12/25/2014: CLINICAL DATA: 44 year old female with hyperthyroidism and history of thyroid nodules.  EXAM: THYROID ULTRASOUND  TECHNIQUE: Ultrasound examination of the thyroid gland and adjacent soft tissues was performed.  COMPARISON: None.  FINDINGS: Right thyroid lobe  Measurements: 4.2 x 1.3 x 1.7 cm. Diffusely heterogeneous thyroid parenchyma. There are multiple small hypoechoic solid nodules centrally within the gland measuring less than 10 mm. There is a single ovoid hypoechoic solid nodule in the medial mid gland which measures 11 x 5 x 9 mm.  Left thyroid lobe  Measurements: 4 x 1 x 2 cm. Diffusely heterogeneous thyroid parenchyma. At least 3 small hypoechoic solid nodules are present within the mid to lower gland measuring 5 and 6 mm in diameter.  Isthmus  Thickness: 0.6 cm. No nodules visualized.  Lymphadenopathy  None visualized.  IMPRESSION: 1. Diffusely heterogeneous thyroid parenchyma as can be seen in the setting of thyroiditis. 2. Small bilateral thyroid nodules measuring up to 1.1 cm on the right. Findings do not meet current SRU consensus criteria for biopsy. Follow-up by clinical exam is recommended. If patient has known risk factors for thyroid carcinoma, consider follow-up ultrasound in 12 months. If patient is clinically hyperthyroid, consider nuclear medicine thyroid uptake and scan.  Reference: Management of Thyroid Nodules Detected at [place]: Society of Radiologists in [place]. Radiology 2005; [id number].   Electronically Signed By: Jacqulynn Cadet M.D. On: 12/24/2014 10:53           CLINICAL DATA: Dysphagia, history of thyroid nodule  EXAM: ESOPHOGRAM / BARIUM SWALLOW / BARIUM TABLET STUDY  TECHNIQUE: Combined double contrast and single contrast examination performed using effervescent crystals, thick barium liquid, and thin barium liquid. The patient was observed with  fluoroscopy swallowing a 13 mm barium sulphate tablet.  FLUOROSCOPY TIME: Radiation Exposure Index (as provided by the fluoroscopic device): 85 Gy per sq cm  If the device does not provide the exposure index:  Fluoroscopy Time: 1 minutes 54 seconds  Number of Acquired Images:  COMPARISON: None.  FINDINGS: Initially double-contrast barium swallow was performed. The mucosa of the esophagus is unremarkable. A single contrast study shows the swallowing mechanism to be normal. There is very minimal indentation of the left lower cervical esophagus which may be due to thyroid nodules. Esophageal peristalsis is normal. No hiatal hernia is seen. There is  moderate gastroesophageal reflux demonstrated. A barium pill was given at the end of the study which passed into the stomach without delay.  IMPRESSION: 1. Moderate gastroesophageal reflux. Barium pill passes into the stomach without delay. 2. Minimal indentation upon the lower cervical esophagus may be due to previously demonstrated thyroid nodules.   Electronically Signed By: Ivar Drape M.D. On: 12/25/2014 12:11   Thyroid nodules small and stable in size. Minimal indentation from one of the thyroid nodules on the esophagus. Based on the studies above, there is no indication for surgery. I would suggest that she sees ENT to explore other possible causes for her symptoms.

## 2014-12-19 DIAGNOSIS — R7989 Other specified abnormal findings of blood chemistry: Secondary | ICD-10-CM | POA: Insufficient documentation

## 2014-12-19 DIAGNOSIS — E042 Nontoxic multinodular goiter: Secondary | ICD-10-CM | POA: Insufficient documentation

## 2014-12-19 LAB — TSH: TSH: 0.358 u[IU]/mL (ref 0.350–4.500)

## 2014-12-19 LAB — T3, FREE: T3, Free: 3.1 pg/mL (ref 2.3–4.2)

## 2014-12-19 LAB — T4, FREE: Free T4: 0.98 ng/dL (ref 0.80–1.80)

## 2014-12-24 ENCOUNTER — Other Ambulatory Visit: Payer: Self-pay | Admitting: Internal Medicine

## 2014-12-24 ENCOUNTER — Ambulatory Visit: Admission: RE | Admit: 2014-12-24 | Payer: 59 | Source: Ambulatory Visit

## 2014-12-24 ENCOUNTER — Ambulatory Visit
Admission: RE | Admit: 2014-12-24 | Discharge: 2014-12-24 | Disposition: A | Discharge status: Home / Self Care | Payer: 59 | Source: Ambulatory Visit | Attending: Internal Medicine | Admitting: Internal Medicine

## 2014-12-24 DIAGNOSIS — R7989 Other specified abnormal findings of blood chemistry: Secondary | ICD-10-CM

## 2014-12-25 ENCOUNTER — Ambulatory Visit
Admission: RE | Admit: 2014-12-25 | Discharge: 2014-12-25 | Disposition: A | Discharge status: Home / Self Care | Payer: 59 | Source: Ambulatory Visit | Attending: Internal Medicine | Admitting: Internal Medicine

## 2014-12-25 DIAGNOSIS — R7989 Other specified abnormal findings of blood chemistry: Secondary | ICD-10-CM

## 2014-12-25 NOTE — Addendum Note (Signed)
Addended by: Philemon Kingdom on: 12/25/2014 01:03 PM   Modules accepted: Level of Service

## 2015-01-06 ENCOUNTER — Telehealth: Payer: Self-pay

## 2015-01-06 NOTE — Telephone Encounter (Signed)
Patient states her titers were done  In 2011 and they are not on the [place] immunization record.   Please pull record from storage, make copy , call patient [phone] (H)

## 2015-01-07 NOTE — Telephone Encounter (Signed)
Records ready for pick up. Left voicemail to pick up and sign ROI form

## 2015-02-23 ENCOUNTER — Other Ambulatory Visit: Payer: Self-pay | Admitting: Internal Medicine

## 2015-03-24 ENCOUNTER — Encounter (HOSPITAL_COMMUNITY): Payer: Self-pay | Admitting: Emergency Medicine

## 2015-03-24 ENCOUNTER — Emergency Department (INDEPENDENT_AMBULATORY_CARE_PROVIDER_SITE_OTHER)
Admission: EM | Admit: 2015-03-24 | Discharge: 2015-03-24 | Disposition: A | Discharge status: Home / Self Care | Payer: PRIVATE HEALTH INSURANCE | Source: Home / Self Care | Attending: Emergency Medicine | Admitting: Emergency Medicine

## 2015-03-24 ENCOUNTER — Ambulatory Visit: Payer: Self-pay

## 2015-03-24 DIAGNOSIS — S86902A Unspecified injury of unspecified muscle(s) and tendon(s) at lower leg level, left leg, initial encounter: Secondary | ICD-10-CM | POA: Diagnosis not present

## 2015-03-24 DIAGNOSIS — S86812A Strain of other muscle(s) and tendon(s) at lower leg level, left leg, initial encounter: Secondary | ICD-10-CM | POA: Diagnosis not present

## 2015-03-24 MED ORDER — HYDROCODONE-ACETAMINOPHEN 5-325 MG PO TABS: 1.0000 | ORAL_TABLET | ORAL | Status: DC | PRN

## 2015-03-24 NOTE — Discharge Instructions (Signed)
Muscle Strain A muscle strain is an injury that occurs when a muscle is stretched beyond its normal length. Usually a small number of muscle fibers are torn when this happens. Muscle strain is rated in degrees. First-degree strains have the least amount of muscle fiber tearing and pain. Second-degree and third-degree strains have increasingly more tearing and pain.  Usually, recovery from muscle strain takes 1-2 weeks. Complete healing takes 5-6 weeks.  CAUSES  Muscle strain happens when a sudden, violent force placed on a muscle stretches it too far. This may occur with lifting, sports, or a fall.  RISK FACTORS Muscle strain is especially common in athletes.  SIGNS AND SYMPTOMS At the site of the muscle strain, there may be:  Pain.  Bruising.  Swelling.  Difficulty using the muscle due to pain or lack of normal function. DIAGNOSIS  Your health care provider will perform a physical exam and ask about your medical history. TREATMENT  Often, the best treatment for a muscle strain is resting, icing, and applying cold compresses to the injured area.  HOME CARE INSTRUCTIONS   Use the PRICE method of treatment to promote muscle healing during the first 2-3 days after your injury. The PRICE method involves:  Protecting the muscle from being injured again.  Restricting your activity and resting the injured body part.  Icing your injury. To do this, put ice in a plastic bag. Place a towel between your skin and the bag. Then, apply the ice and leave it on from 15-20 minutes each hour. After the third day, switch to moist heat packs.  Apply compression to the injured area with a splint or elastic bandage. Be careful not to wrap it too tightly. This may interfere with blood circulation or increase swelling.  Elevate the injured body part above the level of your heart as often as you can.  Only take over-the-counter or prescription medicines for pain, discomfort, or fever as directed by your  health care provider.  Warming up prior to exercise helps to prevent future muscle strains. SEEK MEDICAL CARE IF:   You have increasing pain or swelling in the injured area.  You have numbness, tingling, or a significant loss of strength in the injured area. MAKE SURE YOU:   Understand these instructions.  Will watch your condition.  Will get help right away if you are not doing well or get worse. Document Released: 07/18/2005 Document Revised: 05/08/2013 Document Reviewed: 02/14/2013 [facility] Patient Information 2015 [place], [place]. This information is not intended to replace advice given to you by your health care provider. Make sure you discuss any questions you have with your health care provider.  Possible Medial Head Gastrocnemius Tear (Tennis Leg), with Rehab Medial head gastrocnemius tear, also called tennis leg, is a tear (strain) in a muscle or tendon of the inner portion (medial head) of one of the calf muscles (gastrocnemius). The inner portion of the calf muscle attaches to the thigh bone (femur) and is responsible for bending the knee and straightening the foot (standing "on tiptoe"). Strains are classified into three categories. Grade 1 strains cause pain, but the tendon is not lengthened. Grade 2 strains include a lengthened ligament, due to the ligament being stretched or partially ruptured. With grade 2 strains there is still function, although function may be decreased. Grade 3 strains involve a complete tear of the tendon or muscle, and function is usually impaired. SYMPTOMS   Sudden "pop" or tear felt at the time of injury.  Pain, tenderness,  swelling, warmth, or redness over the middle inner calf.  Pain and weakness with ankle motion, especially flexing the ankle against resistance, as well as pain with lifting up the foot (extending the ankle).  Bruising (contusion) of the calf, heel, and sometimes the foot within 48 hours of injury.  Muscle spasm in the  calf. CAUSES  Muscle and ligament strains occur when a force is placed on the muscle or ligament that is greater than it can handle. Common causes of injury include:  Direct hit (trauma) to the calf.  Sudden forceful pushing off or landing on the foot (jumping, landing, serving a tennis ball, lunging). RISK INCREASES WITH:  Sports that require sudden, explosive calf muscle contraction, such as those involving jumping (basketball), hill running, quick starts (running), or lunging (racquetball, tennis).  Contact sports (football, soccer, hockey).  Poor strength and flexibility.  Previous lower limb injury. PREVENTION  Warm up and stretch properly before activity.  Allow for adequate recovery between workouts.  Maintain physical fitness:  Strength, flexibility, and endurance.  Cardiovascular fitness.  Learn and use proper exercise technique.  Complete rehabilitation after lower limb injury, before returning to competition or practice. PROGNOSIS  If treated properly, tennis leg usually heals within 6 weeks of nonsurgical treatment.  RELATED COMPLICATIONS   Longer healing time, if not properly treated or if not given enough time to heal.  Recurring symptoms and injury, if activity is resumed too soon, with overuse, with a direct blow, or with poor technique.  If untreated, may progress to a complete tear (rare) or other injury, due to limping and favoring of the injured leg.  Persistent limping, due to scarring and shortening of the calf muscles, as a result of inadequate rehabilitation.  Prolonged disability. TREATMENT  Treatment first involves the use of ice and medication to help reduce pain and inflammation. The use of strengthening and stretching exercises may help reduce pain with activity. These exercises may be performed at home or with a therapist. For severe injuries, referral to a therapist may be needed for further evaluation and treatment. Your caregiver may  advise that you wear a brace to help healing. Sometimes, crutches are needed until you can walk without limping. Rarely, surgery is needed.  MEDICATION   If pain medicine is needed, nonsteroidal anti-inflammatory medicines (aspirin and ibuprofen), or other minor pain relievers (acetaminophen), are often advised.  Do not take pain medicine for 7 days before surgery.  Prescription pain relievers may be given, if your caregiver thinks they are needed. Use only as directed and only as much as you need. HEAT AND COLD  Cold treatment (icing) should be applied for 10 to 15 minutes every 2 to 3 hours for inflammation and pain, and immediately after activity that aggravates your symptoms. Use ice packs or an ice massage.  Heat treatment may be used before performing stretching and strengthening activities prescribed by your caregiver, physical therapist, or athletic trainer. Use a heat pack or a warm water soak. SEEK MEDICAL CARE IF:   Symptoms get worse or do not improve in 2 weeks, despite treatment.  Numbness or tingling develops.  New, unexplained symptoms develop. (Drugs used in treatment may produce side effects.) EXERCISES  RANGE OF MOTION (ROM) AND STRETCHING EXERCISES - Medial Head Gastrocnemius Tear (Tennis Leg) These exercises may help you when beginning to rehabilitate your injury. Your symptoms may resolve with or without further involvement from your physician, physical therapist, or athletic trainer. While completing these exercises, remember:   Restoring  tissue flexibility helps normal motion to return to the joints. This allows healthier, less painful movement and activity.  An effective stretch should be held for at least 30 seconds.  A stretch should never be painful. You should only feel a gentle lengthening or release in the stretched tissue. STRETCH - Gastrocsoleus  Sit with your right / left leg extended. Holding onto both ends of a belt or towel, loop it around the ball  of your foot.  Keeping your right / left ankle and foot relaxed and your knee straight, pull your foot and ankle toward you using the belt.  You should feel a gentle stretch behind your calf or knee. Hold this position for __________ seconds. Repeat __________ times. Complete this stretch __________ times per day.  RANGE OF MOTION - Ankle Dorsiflexion, Active Assisted   Remove your shoes and sit on a chair, preferably not on a carpeted surface.  Place your right / left foot directly under the knee. Extend your opposite leg for support.  Keeping your heel down, slide your right / left foot back toward the chair, until you feel a stretch at your ankle or calf. If you do not feel a stretch, slide your bottom forward to the edge of the chair, while still keeping your heel down.  Hold this stretch for __________ seconds. Repeat __________ times. Complete this stretch __________ times per day.  STRETCH - Gastroc, Standing   Place your hands on a wall.  Extend your right / left leg behind you, keeping the front knee somewhat bent.  Slightly point your toes inward on your back foot.  Keeping your right / left heel on the floor and your knee straight, shift your weight toward the wall, not allowing your back to arch.  You should feel a gentle stretch in the right / left calf. Hold this position for __________ seconds. Repeat __________ times. Complete this stretch __________ times per day. STRETCH - Soleus, Standing   Place your hands on a wall.  Extend your right / left leg behind you, keeping the other knee somewhat bent.  Point your toes of your back foot slightly inward.  Keep your right / left heel on the floor, bend your back knee, and slightly shift your weight over the back leg so that you feel a gentle stretch deep in your back calf.  Hold this position for __________ seconds. Repeat __________ times. Complete this stretch __________ times per day. STRETCH - Gastrocsoleus,  Standing Note: This exercise can place a lot of stress on your foot and ankle. Please complete this exercise only if specifically instructed by your caregiver.   Place the ball of your right / left foot on a step, keeping your other foot firmly on the same step.  Hold on to the wall or a rail for balance.  Slowly lift your other foot, allowing your body weight to press your heel down over the edge of the step.  You should feel a stretch in your right / left calf.  Hold this position for __________ seconds.  Repeat this exercise with a slight bend in your right / left knee. Repeat __________ times. Complete this stretch __________ times per day.  STRENGTHENING EXERCISES - Medial Head Gastrocnemius Tear (Tennis Leg) These exercises may help you when beginning to rehabilitate your injury. They may resolve your symptoms with or without further involvement from your physician, physical therapist, or athletic trainer. While completing these exercises, remember:   Muscles can gain both the  endurance and the strength needed for everyday activities through controlled exercises.  Complete these exercises as instructed by your physician, physical therapist, or athletic trainer. Increase the resistance and repetitions only as guided by your caregiver. STRENGTH - Plantar-flexors  Sit with your right / left leg extended. Holding onto both ends of a rubber exercise band or tubing, loop it around the ball of your foot. Keep a slight tension in the band.  Slowly push your toes away from you, pointing them downward.  Hold this position for __________ seconds. Return slowly, controlling the tension in the band. Repeat __________ times. Complete this exercise __________ times per day.  STRENGTH - Plantar-flexors  Stand with your feet shoulder width apart. Steady yourself with a wall or table, using as little support as needed.  Keeping your weight evenly spread over the width of your feet, rise up on  your toes.*  Hold this position for __________ seconds. Repeat __________ times. Complete this exercise __________ times per day.  *If this is too easy, shift your weight toward your right / left leg until you feel challenged. Ultimately, you may be asked to do this exercise while standing on your right / left foot only. STRENGTH - Plantar-flexors, Eccentric Note: This exercise can place a lot of stress on your foot and ankle. Please complete this exercise only if specifically instructed by your caregiver.   Place the balls of your feet on a step. With your hands, use only enough support from a wall or rail to keep your balance.  Keep your knees straight and rise up on your toes.  Slowly shift your weight entirely to your right / left toes and pick up your opposite foot. Gently and with controlled movement, lower your weight through your right / left foot so that your heel drops below the level of the step. You will feel a slight stretch in the back of your right / left calf.  Use the healthy leg to help rise up onto the balls of both feet, then lower weight only onto the right / left leg again. Build up to 15 repetitions. Then progress to 3 sets of 15 repetitions.*  After completing the above exercise, complete the same exercise with a slight knee bend (about 30 degrees). Again, build up to 15 repetitions. Then progress to 3 sets of 15 repetitions.* Perform this exercise __________ times per day.  *When you easily complete 3 sets of 15, your physician, physical therapist, or athletic trainer may advise you to add resistance, by wearing a backpack filled with additional weight. Document Released: 07/18/2005 Document Revised: 12/02/2013 Document Reviewed: 10/30/2008 [facility] Patient Information 2015 [place], [place]. This information is not intended to replace advice given to you by your health care provider. Make sure you discuss any questions you have with your health care provider.

## 2015-03-24 NOTE — ED Notes (Signed)
Pt states she was attending "Fun Day" for work at the park where she was playing a game with a Mudlogger.  She states when she stepped back on her left leg she heard a pop and now she is experiencing pain in her left calf that radiates up the back of her leg.

## 2015-03-24 NOTE — ED Notes (Signed)
Ortho paged for splint short leg.

## 2015-03-24 NOTE — ED Provider Notes (Addendum)
CSN: [id number]     Arrival date & time 03/24/15  1757 History   First MD Initiated Contact with Patient 03/24/15 1944     Chief Complaint  Patient presents with  . Leg Injury   (Consider location/radiation/quality/duration/timing/severity/associated sxs/prior Treatment) HPI Comments: 44 year old obese female was at a fun day outting today participating in physical activity. She was pulling a sheet against forces from other coworkers. She was pulling back with her left leg and experienced a sudden pop and acute moderate to severe pain in the left gastrocnemius. She is able to ambulate for short distance but with much pain and a limp. The pain radiates to the hamstring muscles. Denies other injury. Pain is worse with attempted ambulation, plantarflexion and knee flexion. It is improved when outstretched and elevated. Has not taking anything for pain.   Past Medical History  Diagnosis Date  . Hypertension   . Anxiety    Past Surgical History  Procedure Laterality Date  . Endometrial ablation    . Tubal ligation     Family History  Problem Relation Age of Onset  . Hypertension Mother   . Cancer Maternal Grandmother   . Thyroid disease Neg Hx    Social History  Substance Use Topics  . Smoking status: Never Smoker   . Smokeless tobacco: None  . Alcohol Use: Yes     Comment: occasional   OB History    No data available     Review of Systems  Constitutional: Positive for activity change. Negative for fever and fatigue.  HENT: Negative.   Respiratory: Negative.   Cardiovascular: Negative.   Gastrointestinal: Negative.   Musculoskeletal: Positive for myalgias and gait problem. Negative for neck pain.  Skin: Negative.   Neurological: Negative.     Allergies  Clindamycin/lincomycin  Home Medications   Prior to Admission medications   Medication Sig Start Date End Date Taking? Authorizing Provider  atenolol (TENORMIN) 25 MG tablet Take 1 tablet (25 mg total) by mouth  daily. 12/08/14  Yes Robyn Haber, MD  citalopram (CELEXA) 20 MG tablet Take 1 tablet (20 mg total) by mouth daily. 12/08/14  Yes Robyn Haber, MD  clonazePAM (KLONOPIN) 1 MG tablet Take 1 tablet (1 mg total) by mouth daily. 12/08/14  Yes Robyn Haber, MD  cyclobenzaprine (FLEXERIL) 10 MG tablet TAKE 1 TABLET BY MOUTH TWICE DAILY AS NEEDED   Yes Robyn Haber, MD  hydrochlorothiazide (HYDRODIURIL) 25 MG tablet Take 1 tablet (25 mg total) by mouth daily. 12/08/14  Yes Robyn Haber, MD  omeprazole (PRILOSEC) 20 MG capsule Take 1 capsule (20 mg total) by mouth daily. 12/08/14  Yes Robyn Haber, MD  Vitamin D, Ergocalciferol, (DRISDOL) 50000 UNITS CAPS capsule Take 1 capsule (50,000 Units total) by mouth every 7 (seven) days. 12/08/14  Yes Robyn Haber, MD  atenolol (TENORMIN) 25 MG tablet TAKE 1 TABLET (25 MG TOTAL) BY MOUTH DAILY. 12/08/14   Robyn Haber, MD  citalopram (CELEXA) 20 MG tablet TAKE 1 TABLET (20 MG TOTAL) BY MOUTH DAILY. 12/08/14   Robyn Haber, MD  clonazePAM (KLONOPIN) 1 MG tablet TAKE 1 TABLET BY MOUTH DAILY 12/08/14   Robyn Haber, MD  HYDROcodone-acetaminophen (NORCO/VICODIN) 5-325 MG per tablet Take 1 tablet by mouth every 4 (four) hours as needed. 03/24/15   Janne Napoleon, NP  ibuprofen (ADVIL,MOTRIN) 600 MG tablet TAKE 1 TABLET BY MOUTH EVERY 8 HOURS AS NEEDED. 02/23/15   Orma Flaming, MD  lubiprostone (AMITIZA) 24 MCG capsule Take 24 mcg by mouth daily  with breakfast.    Historical Provider, MD  meloxicam (MOBIC) 7.5 MG tablet TAKE 1 TABLET BY MOUTH 2 TIMES DAILY AS NEEDED FOR PAIN. NO OTHER NSAIDS, TAKE WITH FOOD. 12/08/14   Thao P Le, DO  omeprazole (PRILOSEC) 20 MG capsule TAKE 1 CAPSULE (20 MG TOTAL) BY MOUTH DAILY. 12/08/14   Robyn Haber, MD  Vitamin D, Ergocalciferol, (DRISDOL) 50000 UNITS CAPS capsule TAKE 1 CAPSULE (50,000 UNITS TOTAL) BY MOUTH EVERY 7 (SEVEN) DAYS. 12/08/14   Robyn Haber, MD   BP 135/80 mmHg  Pulse 72  Temp(Src) 98.2 F (36.8 C) (Oral)   Resp 22  SpO2 98% Physical Exam  Constitutional: She is oriented to person, place, and time. She appears well-developed and well-nourished. No distress.  HENT:  Head: Normocephalic and atraumatic.  Eyes: EOM are normal.  Neck: Normal range of motion. Neck supple.  Cardiovascular: Normal rate.   Pulmonary/Chest: Effort normal. No respiratory distress.  Musculoskeletal:  There is tenderness to the proximal posterior calf muscle. No tension swelling. Plantar flexion is weak and produces severe pain in the calf muscle. Distal neurovascular motor Sentry is intact. There is no tenderness to the hamstring musculature. There is limited flexion of the knee due to Pain and body habitus. No knee pain or tenderness.  Lymphadenopathy:    She has no cervical adenopathy.  Neurological: She is alert and oriented to person, place, and time. No cranial nerve deficit.  Skin: Skin is warm and dry.  Nursing note and vitals reviewed.   ED Course  Procedures (including critical care time) Labs Review Labs Reviewed - No data to display  Imaging Review No results found.   MDM   1. Strain of calf muscle, left, initial encounter   2. Lower leg, muscle or tendon injury, left, initial encounter    Concerned about medial gastrocnemius muscle strain or tear. We will apply a posterior splint, have her elevate and apply ice then heat as directed per instructions. Norco 5 mg #15 Call the orthopedist listed on page one for appointment tomorrow. May need ultrasound for confirmation of diagnosis. Limit weightbearing. Crutches.    Janne Napoleon, NP 03/24/15 2035  Janne Napoleon, NP 03/24/15 5038  Janne Napoleon, NP 03/24/15 [id number]

## 2015-03-24 NOTE — ED Notes (Signed)
Ortho repaged...may have been given wrong number in numeric page.

## 2015-03-24 NOTE — ED Notes (Signed)
Ortho called back.  Ortho states it will be a little while.

## 2015-03-24 NOTE — Progress Notes (Signed)
Orthopedic Tech Progress Note Patient Details:  Tracey Reed 1971-02-11 [id number]  Ortho Devices Type of Ortho Device: Ace wrap, Post (short leg) splint Ortho Device/Splint Location: LLE Ortho Device/Splint Interventions: Ordered, Application   Braulio Bosch 03/24/2015, 9:36 PM

## 2015-03-26 ENCOUNTER — Other Ambulatory Visit: Payer: Self-pay | Admitting: Family Medicine

## 2015-04-09 ENCOUNTER — Other Ambulatory Visit: Payer: Self-pay | Admitting: Family Medicine

## 2015-04-17 ENCOUNTER — Emergency Department (HOSPITAL_COMMUNITY)
Admission: EM | Admit: 2015-04-17 | Discharge: 2015-04-17 | Disposition: A | Discharge status: Home / Self Care | Payer: 59 | Source: Home / Self Care | Attending: Family Medicine | Admitting: Family Medicine

## 2015-04-17 ENCOUNTER — Encounter (HOSPITAL_COMMUNITY): Payer: Self-pay | Admitting: *Deleted

## 2015-04-17 DIAGNOSIS — L509 Urticaria, unspecified: Secondary | ICD-10-CM

## 2015-04-17 MED ORDER — CLONAZEPAM 0.5 MG PO TABS: 0.5000 mg | ORAL_TABLET | Freq: Two times a day (BID) | ORAL | Status: DC | PRN

## 2015-04-17 MED ORDER — HYDROXYZINE HCL 25 MG PO TABS: 25.0000 mg | ORAL_TABLET | Freq: Four times a day (QID) | ORAL | Status: DC

## 2015-04-17 NOTE — ED Notes (Signed)
Pt  Reports   Symptoms  Of  A  Rash   That  Itches     X  sev   Weeks           She  Reports         Pt  Reports    Started  voltaren  Creme  Recently             Pt  Reports  As  Well  Some  Joint pain  And is  Unable  To focus  Well

## 2015-04-17 NOTE — ED Provider Notes (Signed)
CSN: [id number]     Arrival date & time 04/17/15  1805 History   First MD Initiated Contact with Patient 04/17/15 1857     Chief Complaint  Patient presents with  . Rash   (Consider location/radiation/quality/duration/timing/severity/associated sxs/prior Treatment) Patient is a 44 y.o. female presenting with rash. The history is provided by the patient.  Rash Location:  Head/neck, shoulder/arm and torso Head/neck rash location:  R neck and L neck Shoulder/arm rash location:  L shoulder and R shoulder Torso rash location:  Upper back Quality: dryness, itchiness and swelling   Severity:  Moderate Progression:  Spreading Chronicity:  New Context comment:  Stress issues as trigger. Relieved by:  None tried Worsened by:  Nothing tried Ineffective treatments:  None tried Associated symptoms: no fever, no throat swelling and no tongue swelling     Past Medical History  Diagnosis Date  . Hypertension   . Anxiety    Past Surgical History  Procedure Laterality Date  . Endometrial ablation    . Tubal ligation     Family History  Problem Relation Age of Onset  . Hypertension Mother   . Cancer Maternal Grandmother   . Thyroid disease Neg Hx    Social History  Substance Use Topics  . Smoking status: Never Smoker   . Smokeless tobacco: None  . Alcohol Use: Yes     Comment: occasional   OB History    No data available     Review of Systems  Constitutional: Negative for fever.  Skin: Positive for rash.  Psychiatric/Behavioral: Positive for decreased concentration. The patient is nervous/anxious.   All other systems reviewed and are negative.   Allergies  Clindamycin/lincomycin  Home Medications   Prior to Admission medications   Medication Sig Start Date End Date Taking? Authorizing Provider  atenolol (TENORMIN) 25 MG tablet TAKE 1 TABLET (25 MG TOTAL) BY MOUTH DAILY. 12/08/14   Robyn Haber, MD  atenolol (TENORMIN) 25 MG tablet Take 1 tablet (25 mg total) by mouth  daily. PATIENT NEEDS OFFICE VISIT FOR ADDITIONAL REFILLS 03/27/15   Robyn Haber, MD  citalopram (CELEXA) 20 MG tablet TAKE 1 TABLET (20 MG TOTAL) BY MOUTH DAILY. 12/08/14   Robyn Haber, MD  citalopram (CELEXA) 20 MG tablet Take 1 tablet (20 mg total) by mouth daily. PATIENT NEEDS OFFICE VISIT FOR ADDITIONAL REFILLS 03/27/15   Robyn Haber, MD  clonazePAM (KLONOPIN) 0.5 MG tablet Take 1 tablet (0.5 mg total) by mouth 2 (two) times daily as needed for anxiety. 04/17/15   Billy Fischer, MD  cyclobenzaprine (FLEXERIL) 10 MG tablet Take 1 tablet (10 mg total) by mouth 2 (two) times daily as needed. PATIENT NEEDS OFFICE VISIT FOR ADDITIONAL REFILLS 04/13/15   Robyn Haber, MD  hydrochlorothiazide (HYDRODIURIL) 25 MG tablet Take 1 tablet (25 mg total) by mouth daily. 12/08/14   Robyn Haber, MD  HYDROcodone-acetaminophen (NORCO/VICODIN) 5-325 MG per tablet Take 1 tablet by mouth every 4 (four) hours as needed. 03/24/15   Janne Napoleon, NP  hydrOXYzine (ATARAX/VISTARIL) 25 MG tablet Take 1 tablet (25 mg total) by mouth every 6 (six) hours. For itching and hives 04/17/15   Billy Fischer, MD  ibuprofen (ADVIL,MOTRIN) 600 MG tablet TAKE 1 TABLET BY MOUTH EVERY 8 HOURS AS NEEDED. 02/23/15   Orma Flaming, MD  lubiprostone (AMITIZA) 24 MCG capsule Take 24 mcg by mouth daily with breakfast.    Historical Provider, MD  meloxicam (MOBIC) 7.5 MG tablet TAKE 1 TABLET BY MOUTH 2 TIMES  DAILY AS NEEDED FOR PAIN. NO OTHER NSAIDS, TAKE WITH FOOD. 12/08/14   Thao P Le, DO  omeprazole (PRILOSEC) 20 MG capsule TAKE 1 CAPSULE (20 MG TOTAL) BY MOUTH DAILY. 12/08/14   Robyn Haber, MD  omeprazole (PRILOSEC) 20 MG capsule Take 1 capsule (20 mg total) by mouth daily. PATIENT NEEDS OFFICE VISIT FOR ADDITIONAL REFILLS 03/27/15   Robyn Haber, MD  Vitamin D, Ergocalciferol, (DRISDOL) 50000 UNITS CAPS capsule Take 1 capsule (50,000 Units total) by mouth every 7 (seven) days. 12/08/14   Robyn Haber, MD  Vitamin D, Ergocalciferol,  (DRISDOL) 50000 UNITS CAPS capsule TAKE 1 CAPSULE (50,000 UNITS TOTAL) BY MOUTH EVERY 7 (SEVEN) DAYS. 12/08/14   Robyn Haber, MD   Meds Ordered and Administered this Visit  Medications - No data to display  BP 160/79 mmHg  Pulse 86  Temp(Src) 98.5 F (36.9 C) (Oral)  Resp 18  SpO2 97% No data found.   Physical Exam  Constitutional: She is oriented to person, place, and time. She appears well-developed and well-nourished. No distress.  Cardiovascular: Normal heart sounds.   Pulmonary/Chest: Breath sounds normal.  Neurological: She is alert and oriented to person, place, and time.  Skin: Skin is warm and dry. Rash noted.  Hives across upper back and arms, migratory, pruritic.  Nursing note and vitals reviewed.   ED Course  Procedures (including critical care time)  Labs Review Labs Reviewed - No data to display  Imaging Review No results found.   Visual Acuity Review  Right Eye Distance:   Left Eye Distance:   Bilateral Distance:    Right Eye Near:   Left Eye Near:    Bilateral Near:         MDM   1. Hives of unknown origin        Billy Fischer, MD 04/17/15 [phone]

## 2015-04-17 NOTE — Discharge Instructions (Signed)
Use medicine as needed, have dr Joseph Art check your thyroid as problems can cause hives,

## 2015-04-20 ENCOUNTER — Ambulatory Visit: Payer: 59 | Admitting: Internal Medicine

## 2015-04-26 ENCOUNTER — Ambulatory Visit (INDEPENDENT_AMBULATORY_CARE_PROVIDER_SITE_OTHER): Payer: 59 | Admitting: Family Medicine

## 2015-04-26 VITALS — BP 118/78 | HR 50 | Temp 97.6°F | Resp 16 | Ht 66.0 in | Wt 291.8 lb

## 2015-04-26 DIAGNOSIS — E559 Vitamin D deficiency, unspecified: Secondary | ICD-10-CM | POA: Diagnosis not present

## 2015-04-26 DIAGNOSIS — F411 Generalized anxiety disorder: Secondary | ICD-10-CM

## 2015-04-26 DIAGNOSIS — L501 Idiopathic urticaria: Secondary | ICD-10-CM

## 2015-04-26 DIAGNOSIS — I10 Essential (primary) hypertension: Secondary | ICD-10-CM

## 2015-04-26 DIAGNOSIS — R5383 Other fatigue: Secondary | ICD-10-CM

## 2015-04-26 DIAGNOSIS — E119 Type 2 diabetes mellitus without complications: Secondary | ICD-10-CM

## 2015-04-26 DIAGNOSIS — M545 Low back pain, unspecified: Secondary | ICD-10-CM

## 2015-04-26 DIAGNOSIS — M5137 Other intervertebral disc degeneration, lumbosacral region: Secondary | ICD-10-CM

## 2015-04-26 DIAGNOSIS — G8929 Other chronic pain: Secondary | ICD-10-CM | POA: Diagnosis not present

## 2015-04-26 DIAGNOSIS — R7309 Other abnormal glucose: Secondary | ICD-10-CM | POA: Diagnosis not present

## 2015-04-26 DIAGNOSIS — E042 Nontoxic multinodular goiter: Secondary | ICD-10-CM

## 2015-04-26 DIAGNOSIS — M51379 Other intervertebral disc degeneration, lumbosacral region without mention of lumbar back pain or lower extremity pain: Secondary | ICD-10-CM

## 2015-04-26 DIAGNOSIS — R7303 Prediabetes: Secondary | ICD-10-CM

## 2015-04-26 LAB — POCT GLYCOSYLATED HEMOGLOBIN (HGB A1C): Hemoglobin A1C: 6.7

## 2015-04-26 MED ORDER — CYCLOBENZAPRINE HCL 10 MG PO TABS: 10.0000 mg | ORAL_TABLET | Freq: Three times a day (TID) | ORAL | Status: DC | PRN

## 2015-04-26 MED ORDER — BUPROPION HCL ER (XL) 150 MG PO TB24: 150.0000 mg | ORAL_TABLET | Freq: Every day | ORAL | Status: DC

## 2015-04-26 MED ORDER — BLOOD GLUCOSE MONITOR KIT: PACK | Status: DC

## 2015-04-26 MED ORDER — OMEPRAZOLE 20 MG PO CPDR: 20.0000 mg | DELAYED_RELEASE_CAPSULE | Freq: Every day | ORAL | Status: DC

## 2015-04-26 MED ORDER — CLONAZEPAM 0.5 MG PO TABS: 0.5000 mg | ORAL_TABLET | Freq: Two times a day (BID) | ORAL | Status: DC | PRN

## 2015-04-26 MED ORDER — CITALOPRAM HYDROBROMIDE 10 MG PO TABS: 10.0000 mg | ORAL_TABLET | Freq: Every day | ORAL | Status: DC

## 2015-04-26 MED ORDER — HYDROXYZINE HCL 25 MG PO TABS: 25.0000 mg | ORAL_TABLET | ORAL | Status: DC | PRN

## 2015-04-26 MED ORDER — IBUPROFEN 800 MG PO TABS: 800.0000 mg | ORAL_TABLET | Freq: Three times a day (TID) | ORAL | Status: DC | PRN

## 2015-04-26 MED ORDER — HYDROCHLOROTHIAZIDE 25 MG PO TABS: 25.0000 mg | ORAL_TABLET | Freq: Every day | ORAL | Status: DC

## 2015-04-26 MED ORDER — LUBIPROSTONE 24 MCG PO CAPS: 24.0000 ug | ORAL_CAPSULE | Freq: Two times a day (BID) | ORAL | Status: DC

## 2015-04-26 MED ORDER — LEVOCETIRIZINE DIHYDROCHLORIDE 5 MG PO TABS: 5.0000 mg | ORAL_TABLET | Freq: Every evening | ORAL | Status: DC

## 2015-04-26 NOTE — Progress Notes (Addendum)
Subjective:    Patient ID: Tracey Reed, female    DOB: 12/29/1970, 44 y.o.   MRN: [id number] This chart was scribed for [name], MD by Zola Button, Medical Scribe. This patient was seen in Room 12 and the patient's care was started at 2:53 PM.   Chief Complaint  Patient presents with  . Medication Refill    all meds  . Depression    per triage  . Flu Vaccine    HPI HPI Comments: Tracey Reed is a 44 y.o. female with a history of anxiety, hypertension, and multiple thyroid nodules who presents to the [facility] for a medication refill.   She was last seen in our office over 6 months ago. At that time she was having bilateral lower extremity edema which was chronic. She was tried on Contrave and Belviq for weight loss. She was using Amitiza for IBS. She has a history of pre-diabetes. She was found to have a multi-nodular goiter and was refered to Dr. Cruzita Lederer. She was started on HCTZ at the last visit. On re-check, her thyroid panel had normalized, so was just going to follow clinically. They were going to get a thyroid ultrasound. The thyroid nodules did not meet criteria for biopsy. Plan to follow clinically, consider repeating US in 1 year. She also had a swallow study that showed minimal impact on her esophagus from her thyroid nodule and GERD.   Anxiety: Patient notes that her anxiety has worsened. She would like to switch off of the citalopram and try a new medication.  Difficulty Concentrating: Patient also notes that she has always had some difficulty concentrating and is interested in ADHD testing.  Rash/Myalgias: Patient also reports having an urticarial rash to her back and face as well as generalized myalgias. She is concerned she may have lupus or another autoimmune condition and would like to be tested.  Chronic Low Back Pain: Patient has chronic low back pain due to DDD at L1-L2. She takes flexeril as needed for this. The pain is  worse when she is inactive.  Vitamins/Supplements: She currently takes prescription strength vitamin D once a week.  Hypertension: Patient has not been checking her blood pressure at home.  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  Past Medical History  Diagnosis Date  . Hypertension   . Anxiety    Past Surgical History  Procedure Laterality Date  . Endometrial ablation    . Tubal ligation     Current Outpatient Prescriptions on File Prior to Visit  Medication Sig Dispense Refill  . atenolol (TENORMIN) 25 MG tablet TAKE 1 TABLET (25 MG TOTAL) BY MOUTH DAILY. 90 tablet 0  . Vitamin D, Ergocalciferol, (DRISDOL) 50000 UNITS CAPS capsule Take 1 capsule (50,000 Units total) by mouth every 7 (seven) days. 30 capsule 2   No current facility-administered medications on file prior to visit.   Allergies  Allergen Reactions  . Clindamycin/Lincomycin Rash   Family History  Problem Relation Age of Onset  . Hypertension Mother   . Cancer Maternal Grandmother   . Thyroid disease Neg Hx    Social History   Social History  . Marital Status: Married    Spouse Name: N/A  . Number of Children: N/A  . Years of Education: N/A   Social History Main Topics  . Smoking status: Never Smoker   . Smokeless tobacco: None  . Alcohol Use: Yes  Comment: occasional  . Drug Use: None  . Sexual Activity: Not Asked   Other Topics Concern  . None   Social History Narrative     Review of Systems  Constitutional: Positive for fatigue. Negative for diaphoresis, activity change, appetite change and unexpected weight change.  Eyes: Negative for visual disturbance.  Respiratory: Negative for chest tightness.   Cardiovascular: Negative for chest pain and palpitations.  Gastrointestinal: Negative for vomiting.  Endocrine: Negative for polydipsia and polyuria.  Musculoskeletal: Positive for myalgias, back pain, arthralgias, neck pain and  neck stiffness. Negative for joint swelling and gait problem.  Skin: Positive for color change and rash.  Allergic/Immunologic: Positive for environmental allergies. Negative for immunocompromised state.  Neurological: Negative for tremors, syncope and numbness.  Psychiatric/Behavioral: Positive for sleep disturbance, dysphoric mood and decreased concentration. Negative for agitation. The patient is nervous/anxious. The patient is not hyperactive.        Objective:  BP 118/78 mmHg  Pulse 50  Temp(Src) 97.6 F (36.4 C) (Oral)  Resp 16  Ht 5' 6"  (1.676 m)  Wt 291 lb 12.8 oz (132.36 kg)  BMI 47.12 kg/m2  SpO2 99%  Physical Exam  Constitutional: She is oriented to person, place, and time. She appears well-developed and well-nourished. No distress.  HENT:  Head: Normocephalic and atraumatic.  Mouth/Throat: Oropharynx is clear and moist. No oropharyngeal exudate.  Eyes: Pupils are equal, round, and reactive to light.  Neck: Neck supple.  Cardiovascular: Normal rate.   Pulmonary/Chest: Effort normal.  Musculoskeletal: She exhibits no edema.  Neurological: She is alert and oriented to person, place, and time. No cranial nerve deficit.  Skin: Skin is warm and dry. No rash noted.  Psychiatric: She has a normal mood and affect. Her behavior is normal.  Nursing note and vitals reviewed.     Results for orders placed or performed in visit on 04/26/15  POCT glycosylated hemoglobin (Hb A1C)  Result Value Ref Range   Hemoglobin A1C 6.7     Assessment & Plan:   1. Essential hypertension - stop atenolol as pt is bradycardic which may be exacerbating her fatigue, cont hctz. Check bp at work and call if elevated so we can start acei instead which is now indicated due to new diagnosis of DM.  2. Anxiety state - using prn klonopin for yr - usually takes just 1x/d in the a.m. But will try changing to qhs due to c/o fatigue. Cont prn hydroxyzine  3. Multiple thyroid nodules - saw Dr. Cruzita Lederer, tsh  was last nml, cont to monitor thyroid panels and would need 24 hr RNuptake scan if tsh becomes suppressed, cons recheck thyroid US in 1 yr.  4. Other fatigue - pt concerned she has undiagnosed ADD - daughter has ADD and is on meds. She would like to try ADD medication. Advised pt to contact psych for formal testing and could then consider stimulant therapy. In meantime, will try low dose wellbutrin and so dec citalopram from 20 to 10 to hopefully help with energy and concentration. If can't tol wellbutrin due to worse sleep or anxiety would consider titrating off citalopram and try effexor, zoloft, or strattera.  5. Chronic idiopathic urticaria - cont singulair, restart antihistamine with prn hydroxyzine- will try xyzal since sxs currently very uncontrolled. Pt reports that she has seen allergist in past.  6. Chronic low back pain - refilled flexeril and ibuprofen prn. Pt requests labs to look for an autoimmune cause of sxs "such as lupus".  7. Degeneration  of lumbar or lumbosacral intervertebral disc   8. Prediabetes   9. Vitamin D deficiency - has been on high dose replacement, switch to otc ca <65m and vit D >800 u qd.  10. Type 2 diabetes mellitus without complication - new diagnosis today with a1c 6.7.  rx glucometer and referred to DM ed.. Low threshold for starting pt on med if a1c not < 6.5 at f/u OV in 3-4 mos to help w/ weightloss - metformin or GLP-1.   11,  GERD -   Try to go off ppi if sxs controlled - at least temporarily. Pt reports h/o IBS-C so refilled amitiza - she has not been taking it recently.  Orders Placed This Encounter  Procedures  . Thyroid Panel With TSH  . CBC with Differential/Platelet  . Sedimentation Rate  . C-reactive protein  . ANA  . Vit D  25 hydroxy (rtn osteoporosis monitoring)  . Comprehensive metabolic panel  . Ambulatory referral to diabetic education    Referral Priority:  Routine    Referral Type:  Consultation    Referral Reason:  Specialty Services  Required    Number of Visits Requested:  1  . POCT glycosylated hemoglobin (Hb A1C)    Meds ordered this encounter  Medications  . levocetirizine (XYZAL) 5 MG tablet    Sig: Take 1 tablet (5 mg total) by mouth every evening.    Dispense:  30 tablet    Refill:  11  . hydrOXYzine (ATARAX/VISTARIL) 25 MG tablet    Sig: Take 1 tablet (25 mg total) by mouth every 4 (four) hours as needed for anxiety or itching. For itching and hives    Dispense:  60 tablet    Refill:  5  . clonazePAM (KLONOPIN) 0.5 MG tablet    Sig: Take 1 tablet (0.5 mg total) by mouth 3 times/day as needed-between meals & bedtime for anxiety.    Dispense:  30 tablet    Refill:  3  . cyclobenzaprine (FLEXERIL) 10 MG tablet    Sig: Take 1 tablet (10 mg total) by mouth 3 (three) times daily as needed.    Dispense:  60 tablet    Refill:  2  . lubiprostone (AMITIZA) 24 MCG capsule    Sig: Take 1 capsule (24 mcg total) by mouth 2 (two) times daily with a meal.    Dispense:  60 capsule    Refill:  5  . ibuprofen (ADVIL,MOTRIN) 800 MG tablet    Sig: Take 1 tablet (800 mg total) by mouth every 8 (eight) hours as needed.    Dispense:  60 tablet    Refill:  1  . omeprazole (PRILOSEC) 20 MG capsule    Sig: Take 1 capsule (20 mg total) by mouth daily.    Dispense:  30 capsule    Refill:  11  . hydrochlorothiazide (HYDRODIURIL) 25 MG tablet    Sig: Take 1 tablet (25 mg total) by mouth daily.    Dispense:  90 tablet    Refill:  3  . buPROPion (WELLBUTRIN XL) 150 MG 24 hr tablet    Sig: Take 1 tablet (150 mg total) by mouth daily.    Dispense:  30 tablet    Refill:  1  . citalopram (CELEXA) 10 MG tablet    Sig: Take 1 tablet (10 mg total) by mouth daily.    Dispense:  30 tablet    Refill:  5  . blood glucose meter kit and supplies KIT  Sig: Dispense based on patient and insurance preference. Use up to four times daily as directed. (FOR ICD-9 250.00, 250.01).    Dispense:  1 each    Refill:  0    Order Specific  Question:  Number of strips    Answer:  100    Order Specific Question:  Number of lancets    Answer:  100   Over 40 min spent in face-to-face evaluation of and consultation with patient and coordination of care.  Over 50% of this time was spent counseling this patient.  I personally performed the services described in this documentation, which was scribed in my presence. The recorded information has been reviewed and considered, and addended by me as needed.  [name], MD MPH   By signing my name below, I, Zola Button, attest that this documentation has been prepared under the direction and in the presence of [name], MD.  Electronically Signed: Zola Button, Medical Scribe. 04/26/2015. 2:56 PM.

## 2015-04-26 NOTE — Patient Instructions (Addendum)
You can call the 24-hour [facility] at [phone] or [phone] for immediate assistance. Among several different types of services, they offer an Intensive oupatient program for mood disorders - which is a group type setting Monday-Friday 9-noon.  You can schedule an assessment by calling the above numbers during which the costs for the program and insurance benefits will be reviewed.    No psychological or psychiatric services take physician referrals - they always want the patient to call. Some excellent private psychologist/psychiatrists for individual counseling are:  [facility] [place], [place] [address] Phone: [phone]  [facility]  [address] Jarrett Ables [place], [place] [address]  [phone] - they do the ADD testing but then would send you back to [place] for medications [place], [place], [place] [address]  Phone: [phone]   Talmage Coin, MD, [facility] [address] [place], [place] [address] Phone: [phone]  I do think there is a good chance that your atenolol is adding to some of your fatigue as your pulse is so low.  Your blood pressure has been well controlled so try off of it for just a few days to see if your fatigue improves. If it doesn't, then you can restart it.  If it does but your blood pressure is to high then call us so we can start a different medication.  Go down on your citalopram to 67m (1/2 tab) and start some wellbutrin. This could make your anxiety worse in which case stop the wellbutrin and come back in so we can switch you to a different antidepressant with more effect on your concentration or focus.  Start the xyzal (instead of your zyrtec or claritin) along with singulair for the chronic hives.  You can continue as needed hydroxyzine for itching as well which might help your anxiety. Add  in fiber to avoid constipation.  Blood Glucose Monitoring Monitoring your blood glucose (also know as blood sugar) helps you to manage your diabetes. It also helps you and your health care Tishawna Larouche monitor your diabetes and determine how well your treatment plan is working. WHY SHOULD YOU MONITOR YOUR BLOOD GLUCOSE?  It can help you understand how food, exercise, and medicine affect your blood glucose.  It allows you to know what your blood glucose is at any given moment. You can quickly tell if you are having low blood glucose (hypoglycemia) or high blood glucose (hyperglycemia).  It can help you and your health care Leondre Taul know how to adjust your medicines.  It can help you understand how to manage an illness or adjust medicine for exercise. WHEN SHOULD YOU TEST? Your health care Vinnie Bobst will help you decide how often you should check your blood glucose. This may depend on the type of diabetes you have, your diabetes control, or the types of medicines you are taking. Be sure to write down all of your blood glucose readings so that this information can be reviewed with your health care Jesaiah Fabiano. See below for examples of testing times that your health care Angelize Ryce may suggest. Type 1 Diabetes  Test 4 times a day if you are in good control, using an insulin pump, or perform multiple daily injections.  If your diabetes is not well controlled or if you are sick, you may need to monitor more often.  It is a good idea to also monitor:  Before and after exercise.  Between meals and 2 hours after a meal.  Occasionally between 2:00 a.m. and 3:00 a.m. Type 2 Diabetes  It can vary with each person, but generally, if you are on insulin, test 4 times a day.  If you take medicines by mouth (orally), test 2 times a day.  If you are on a controlled diet, test once a day.  If your diabetes is not well controlled or if you are sick, you may need to monitor more often. HOW TO MONITOR YOUR  BLOOD GLUCOSE Supplies Needed  Blood glucose meter.  Test strips for your meter. Each meter has its own strips. You must use the strips that go with your own meter.  A pricking needle (lancet).  A device that holds the lancet (lancing device).  A journal or log book to write down your results. Procedure  Wash your hands with soap and water. Alcohol is not preferred.  Prick the side of your finger (not the tip) with the lancet.  Gently milk the finger until a small drop of blood appears.  Follow the instructions that come with your meter for inserting the test strip, applying blood to the strip, and using your blood glucose meter. Other Areas to Get Blood for Testing Some meters allow you to use other areas of your body (other than your finger) to test your blood. These areas are called alternative sites. The most common alternative sites are:  The forearm.  The thigh.  The back area of the lower leg.  The palm of the hand. The blood flow in these areas is slower. Therefore, the blood glucose values you get may be delayed, and the numbers are different from what you would get from your fingers. Do not use alternative sites if you think you are having hypoglycemia. Your reading will not be accurate. Always use a finger if you are having hypoglycemia. Also, if you cannot feel your lows (hypoglycemia unawareness), always use your fingers for your blood glucose checks. ADDITIONAL TIPS FOR GLUCOSE MONITORING  Do not reuse lancets.  Always carry your supplies with you.  All blood glucose meters have a 24-hour "hotline" number to call if you have questions or need help.  Adjust (calibrate) your blood glucose meter with a control solution after finishing a few boxes of strips. BLOOD GLUCOSE RECORD KEEPING It is a good idea to keep a daily record or log of your blood glucose readings. Most glucose meters, if not all, keep your glucose records stored in the meter. Some meters come with  the ability to download your records to your home computer. Keeping a record of your blood glucose readings is especially helpful if you are wanting to look for patterns. Make notes to go along with the blood glucose readings because you might forget what happened at that exact time. Keeping good records helps you and your health care Wilberth Damon to work together to achieve good diabetes management.  Document Released: 07/21/2003 Document Revised: 12/02/2013 Document Reviewed: 12/10/2012 [facility] Patient Information 2015 [place], [place]. This information is not intended to replace advice given to you by your health care Iris Tatsch. Make sure you discuss any questions you have with your health care Eura Radabaugh. Diabetes and Standards of Medical Care Diabetes is complicated. You may find that your diabetes team includes a dietitian, nurse, diabetes educator, eye doctor, and more. To help everyone know what is going on and to help you get the care you deserve, the following schedule of care was developed to help keep  you on track. Below are the tests, exams, vaccines, medicines, education, and plans you will need. HbA1c test This test shows how well you have controlled your glucose over the past 2-3 months. It is used to see if your diabetes management plan needs to be adjusted.   It is performed at least 2 times a year if you are meeting treatment goals.  It is performed 4 times a year if therapy has changed or if you are not meeting treatment goals. Blood pressure test  This test is performed at every routine medical visit. The goal is less than 140/90 mm Hg for most people, but 130/80 mm Hg in some cases. Ask your health care Saniyah Mondesir about your goal. Dental exam  Follow up with the dentist regularly. Eye exam  If you are diagnosed with type 1 diabetes as a child, get an exam upon reaching the age of 69 years or older and have had diabetes for 3-5 years. Yearly eye exams are recommended after that initial  eye exam.  If you are diagnosed with type 1 diabetes as an adult, get an exam within 5 years of diagnosis and then yearly.  If you are diagnosed with type 2 diabetes, get an exam as soon as possible after the diagnosis and then yearly. Foot care exam  Visual foot exams are performed at every routine medical visit. The exams check for cuts, injuries, or other problems with the feet.  A comprehensive foot exam should be done yearly. This includes visual inspection as well as assessing foot pulses and testing for loss of sensation.  Check your feet nightly for cuts, injuries, or other problems with your feet. Tell your health care Kylah Maresh if anything is not healing. Kidney function test (urine microalbumin)  This test is performed once a year.  Type 1 diabetes: The first test is performed 5 years after diagnosis.  Type 2 diabetes: The first test is performed at the time of diagnosis.  A serum creatinine and estimated glomerular filtration rate (eGFR) test is done once a year to assess the level of chronic kidney disease (CKD), if present. Lipid profile (cholesterol, HDL, LDL, triglycerides)  Performed every 5 years for most people.  The goal for LDL is less than 100 mg/dL. If you are at high risk, the goal is less than 70 mg/dL.  The goal for HDL is 40 mg/dL-50 mg/dL for men and 50 mg/dL-60 mg/dL for women. An HDL cholesterol of 60 mg/dL or higher gives some protection against heart disease.  The goal for triglycerides is less than 150 mg/dL. Influenza vaccine, pneumococcal vaccine, and hepatitis B vaccine  The influenza vaccine is recommended yearly.  It is recommended that people with diabetes who are over 8 years old get the pneumonia vaccine. In some cases, two separate shots may be given. Ask your health care Anali Cabanilla if your pneumonia vaccination is up to date.  The hepatitis B vaccine is also recommended for adults with diabetes. Diabetes self-management  education  Education is recommended at diagnosis and ongoing as needed. Treatment plan  Your treatment plan is reviewed at every medical visit. Document Released: 05/15/2009 Document Revised: 12/02/2013 Document Reviewed: 12/18/2012 [facility] Patient Information 2015 [place], [place]. This information is not intended to replace advice given to you by your health care Jamileth Putzier. Make sure you discuss any questions you have with your health care Cecile Guevara.

## 2015-04-27 LAB — CBC WITH DIFFERENTIAL/PLATELET
Basophils Absolute: 0 10*3/uL (ref 0.0–0.1)
Basophils Relative: 0 % (ref 0–1)
Eosinophils Absolute: 0.1 10*3/uL (ref 0.0–0.7)
Eosinophils Relative: 1 % (ref 0–5)
HCT: 37 % (ref 36.0–46.0)
Hemoglobin: 12.6 g/dL (ref 12.0–15.0)
Lymphocytes Relative: 46 % (ref 12–46)
Lymphs Abs: 3.9 10*3/uL (ref 0.7–4.0)
MCH: 27 pg (ref 26.0–34.0)
MCHC: 34.1 g/dL (ref 30.0–36.0)
MCV: 79.2 fL (ref 78.0–100.0)
MPV: 8.8 fL (ref 8.6–12.4)
Monocytes Absolute: 0.5 10*3/uL (ref 0.1–1.0)
Monocytes Relative: 6 % (ref 3–12)
Neutro Abs: 3.9 10*3/uL (ref 1.7–7.7)
Neutrophils Relative %: 47 % (ref 43–77)
Platelets: 458 10*3/uL — ABNORMAL HIGH (ref 150–400)
RBC: 4.67 MIL/uL (ref 3.87–5.11)
RDW: 15.8 % — ABNORMAL HIGH (ref 11.5–15.5)
WBC: 8.4 10*3/uL (ref 4.0–10.5)

## 2015-04-27 LAB — COMPREHENSIVE METABOLIC PANEL
ALT: 20 U/L (ref 6–29)
AST: 19 U/L (ref 10–30)
Albumin: 3.7 g/dL (ref 3.6–5.1)
Alkaline Phosphatase: 66 U/L (ref 33–115)
BUN: 11 mg/dL (ref 7–25)
CO2: 32 mmol/L — ABNORMAL HIGH (ref 20–31)
Calcium: 9.7 mg/dL (ref 8.6–10.2)
Chloride: 98 mmol/L (ref 98–110)
Creat: 0.86 mg/dL (ref 0.50–1.10)
Glucose, Bld: 90 mg/dL (ref 65–99)
Potassium: 4.1 mmol/L (ref 3.5–5.3)
Sodium: 137 mmol/L (ref 135–146)
Total Bilirubin: 0.4 mg/dL (ref 0.2–1.2)
Total Protein: 7.4 g/dL (ref 6.1–8.1)

## 2015-04-27 LAB — THYROID PANEL WITH TSH
Free Thyroxine Index: 3.2 (ref 1.4–3.8)
T3 Uptake: 35 % (ref 22–35)
T4, Total: 9.1 ug/dL (ref 4.5–12.0)
TSH: 0.051 u[IU]/mL — ABNORMAL LOW (ref 0.350–4.500)

## 2015-04-27 LAB — C-REACTIVE PROTEIN: CRP: 2.7 mg/dL — ABNORMAL HIGH (ref <0.60)

## 2015-04-27 LAB — VITAMIN D 25 HYDROXY (VIT D DEFICIENCY, FRACTURES): Vit D, 25-Hydroxy: 44 ng/mL (ref 30–100)

## 2015-04-28 LAB — ANA: Anti Nuclear Antibody(ANA): NEGATIVE

## 2015-04-28 LAB — SEDIMENTATION RATE: Sed Rate: 42 mm/hr — ABNORMAL HIGH (ref 0–20)

## 2015-04-29 ENCOUNTER — Other Ambulatory Visit: Payer: Self-pay | Admitting: Internal Medicine

## 2015-04-29 ENCOUNTER — Encounter: Payer: Self-pay | Admitting: Internal Medicine

## 2015-04-29 DIAGNOSIS — R7989 Other specified abnormal findings of blood chemistry: Secondary | ICD-10-CM

## 2015-04-30 ENCOUNTER — Encounter: Payer: Self-pay | Admitting: Family Medicine

## 2015-05-02 ENCOUNTER — Ambulatory Visit: Payer: Self-pay

## 2015-05-12 ENCOUNTER — Ambulatory Visit (INDEPENDENT_AMBULATORY_CARE_PROVIDER_SITE_OTHER): Payer: 59 | Admitting: Family Medicine

## 2015-05-12 VITALS — BP 130/78 | HR 85 | Temp 98.3°F | Resp 18 | Ht 65.0 in | Wt 293.6 lb

## 2015-05-12 DIAGNOSIS — I10 Essential (primary) hypertension: Secondary | ICD-10-CM | POA: Diagnosis not present

## 2015-05-12 DIAGNOSIS — F411 Generalized anxiety disorder: Secondary | ICD-10-CM | POA: Diagnosis not present

## 2015-05-12 DIAGNOSIS — E0590 Thyrotoxicosis, unspecified without thyrotoxic crisis or storm: Secondary | ICD-10-CM

## 2015-05-12 DIAGNOSIS — Z111 Encounter for screening for respiratory tuberculosis: Secondary | ICD-10-CM

## 2015-05-12 DIAGNOSIS — F900 Attention-deficit hyperactivity disorder, predominantly inattentive type: Secondary | ICD-10-CM | POA: Diagnosis not present

## 2015-05-12 DIAGNOSIS — F988 Other specified behavioral and emotional disorders with onset usually occurring in childhood and adolescence: Secondary | ICD-10-CM

## 2015-05-12 MED ORDER — AMPHETAMINE-DEXTROAMPHET ER 20 MG PO CP24: 20.0000 mg | ORAL_CAPSULE | ORAL | Status: DC

## 2015-05-12 NOTE — Progress Notes (Addendum)
Subjective:  This chart was scribed for Tracey Cheadle, MD by Tracey Reed, Medical Scribe. This patient was seen in Room 8 and the patient's care was started at 8:52 PM.   Patient ID: Tracey Reed, female    DOB: [birth year], 44 y.o.   MRN: [id number]  Chief Complaint  Patient presents with  . tb screen    blood draw   . Follow-up    medication    HPI HPI Comments: Tracey Reed is a 44 y.o. female who presents to [facility] for a follow up regarding her medications.  Pt has been having worsening anxiety on citalopram. She has been using klonopin in the morning for over a year, and was complain of fatigue and difficulty concertation, and so she was instructed to switch it to the evening and use hydroxyzine. She was complaining of severe fatigue and complaining of pulse in the 50's, she was advised to stop atenolol and continue taking HCTZ. If blood pressure was elevated she was instructed to call and discuss that. She was only off her atenolol for 3 days, her blood pressure was 140/90 and she began to developing headaches and feeling light headed, so she restarted it. She was also complaining feeling jittery and did not notice and improved with hydroxyzine, and requested something to keep her focused and less jittery and mind racy. She was unable to see a psychologist because she can it take time off work. At last visit, I found out that her thyroid was over active, so Dr. Cruzita Lederer was going to arrange thyroid uptake scan. Pt's daughter has ADD, and she seems to really want stimulant therapy. I talked to pt to see if she wants to try other medications such as Effexor or Strattera. We also decreased citalopram from 20 to 10 and started her on low dose Wellbutrin. Pt had also nearly developed Diabetes, so she was referred to DM ed, and prescribed glucometer.   Blood Pressure: Pt states that after she restarted the atenolol, she does feel that it is working  for her. Pt reports that she does not check her blood pressure or pulse regularly. She notes that taking Wellbutrin have gave her mild relief, however she still presents with headaches in the temples area of the head.   Anxiety:  She reports that her panic attacks have become more moderate.  Pt states that she works Monday- Friday, 8-9 hour shifts, at which she does get very anxious afraid that she forgot doing something while at work. 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 She reports that these events do add up to her anxiety symptoms. She denies fatigue or sleep disturbance.  Hives: Pt reports that she no longer has hives.   Diabetes: Pt indicates that she has been monitoring her blood sugar that is ranging between 115-74.   ADD: Pt notes that her daughter is on extended-release Adderall. She reports trying the medication for herself and finding good results with.  Depression screen [facility] 2/9 05/12/2015 04/26/2015  Decreased Interest 0 2  Down, Depressed, Hopeless 0 2  PHQ - 2 Score 0 4  Altered sleeping - 3  Tired, decreased energy - 3  Change in appetite - 3  Feeling bad or failure about yourself  - 2  Trouble concentrating - 3  Moving slowly or fidgety/restless - 3  Suicidal thoughts - 0  PHQ-9 Score - 21  Difficult doing work/chores - Extremely dIfficult   Past Medical History  Diagnosis Date  . Hypertension   . Anxiety    Current Outpatient Prescriptions on File Prior to Visit  Medication Sig Dispense Refill  . blood glucose meter kit and supplies KIT Dispense based on patient and insurance preference. Use up to four times daily as directed. (FOR ICD-9 250.00, 250.01). 1 each 0  . citalopram (CELEXA) 10 MG tablet Take 1 tablet (10 mg total) by mouth daily. 30 tablet 5  . clonazePAM (KLONOPIN) 0.5 MG tablet Take 1 tablet (0.5 mg total) by mouth 3 times/day as needed-between meals & bedtime for anxiety. 30 tablet 3   . cyclobenzaprine (FLEXERIL) 10 MG tablet Take 1 tablet (10 mg total) by mouth 3 (three) times daily as needed. 60 tablet 2  . hydrochlorothiazide (HYDRODIURIL) 25 MG tablet Take 1 tablet (25 mg total) by mouth daily. 90 tablet 3  . hydrOXYzine (ATARAX/VISTARIL) 25 MG tablet Take 1 tablet (25 mg total) by mouth every 4 (four) hours as needed for anxiety or itching. For itching and hives 60 tablet 5  . ibuprofen (ADVIL,MOTRIN) 800 MG tablet Take 1 tablet (800 mg total) by mouth every 8 (eight) hours as needed. 60 tablet 1  . levocetirizine (XYZAL) 5 MG tablet Take 1 tablet (5 mg total) by mouth every evening. 30 tablet 11  . lubiprostone (AMITIZA) 24 MCG capsule Take 1 capsule (24 mcg total) by mouth 2 (two) times daily with a meal. 60 capsule 5  . omeprazole (PRILOSEC) 20 MG capsule Take 1 capsule (20 mg total) by mouth daily. 30 capsule 11  . Vitamin D, Ergocalciferol, (DRISDOL) 50000 UNITS CAPS capsule Take 1 capsule (50,000 Units total) by mouth every 7 (seven) days. 30 capsule 2   No current facility-administered medications on file prior to visit.   Allergies  Allergen Reactions  . Clindamycin/Lincomycin Rash      Review of Systems  Constitutional: Positive for activity change and fatigue. Negative for fever, chills, diaphoresis, appetite change and unexpected weight change.  Eyes: Negative for visual disturbance.  Respiratory: Negative for cough and shortness of breath.   Cardiovascular: Negative for chest pain, palpitations and leg swelling.  Genitourinary: Negative for decreased urine volume.  Neurological: Positive for headaches. Negative for dizziness and syncope.  Hematological: Does not bruise/bleed easily.  Psychiatric/Behavioral: Positive for decreased concentration. Negative for suicidal ideas, hallucinations, behavioral problems, confusion, sleep disturbance, self-injury, dysphoric mood and agitation. The patient is nervous/anxious. The patient is not hyperactive.         Objective:   Physical Exam  Constitutional: She is oriented to person, place, and time. She appears well-developed and well-nourished. No distress.  HENT:  Head: Normocephalic and atraumatic.  Eyes: EOM are normal. Pupils are equal, round, and reactive to light.  Neck: Neck supple.  Cardiovascular: Normal rate.   Pulmonary/Chest: Effort normal.  Neurological: She is alert and oriented to person, place, and time. No cranial nerve deficit.  Skin: Skin is warm and dry.  Psychiatric: She has a normal mood and affect. Her behavior is normal.  Nursing note and vitals reviewed.  BP 130/78 mmHg  Pulse 85  Temp(Src) 98.3 F (36.8 C) (Oral)  Resp 18  Ht _0  (1.651 m)  Wt 293 lb 9.6 oz (133.176 kg)  BMI 48.86 kg/m2  SpO2 98%     Assessment & Plan:    1. ADD (attention deficit disorder) without hyperactivity - no formal diagnosis prior - pt having difficulty finding  an opportunity to get into psych for formal diagnostic testing. Given self-report add scale for her to return at next visit. Sxs worsening on wellbutrin 150 x 3 wks so d/c and will start trial of adderall xr 20 qd (which is what her daughter is on). Cont celexa 10 (was on 20 sev wks ago).  Suspect in the long-term she may do better on strattera or effexor but since pt is working full-time and in [place] as well as a mother she is no longer able to manage sxs through lifestyle choices as she has prior  2. Anxiety state   3. Hyperthyroidism - has NM uptake scan sched and f/u with Dr. Cruzita Lederer  4. Essential hypertension, benign - cont checking at work  5. Screening for tuberculosis     Orders Placed This Encounter  Procedures  . Quantiferon tb gold assay    Meds ordered this encounter  Medications  . amphetamine-dextroamphetamine (ADDERALL XR) 20 MG 24 hr capsule    Sig: Take 1 capsule (20 mg total) by mouth every morning.    Dispense:  30 capsule    Refill:  0    I personally performed the services described in  this documentation, which was scribed in my presence. The recorded information has been reviewed and considered, and addended by me as needed.  Tracey Cheadle, MD MPH   By signing my name below, I, Rawaa Al Rifaie, attest that this documentation has been prepared under the direction and in the presence of Tracey Cheadle, MD.  Tracey Reed, Medical Scribe. 05/12/2015.  9:17 PM.

## 2015-05-12 NOTE — Progress Notes (Signed)
  Tuberculosis Risk Questionnaire  1. No Were you born outside the [place] in one of the following parts of the world: [place], [place], [place], [place] or [place]?    2. No Have you traveled outside the [place] and lived for more than one month in one of the following parts of the world: [place], [place], [place], [place] or [place]?    3. Yes  Do you have a compromised immune system such as from any of the following conditions:HIV/AIDS, organ or bone marrow transplantation, diabetes, immunosuppressive medicines (e.g. Prednisone, Remicaide), leukemia, lymphoma, cancer of the head or neck, gastrectomy or jejunal bypass, end-stage renal disease (on dialysis), or silicosis?     4. Yes  Have you ever or do you plan on working in: a residential care center, a health care facility, a jail or prison or homeless shelter?    5. No Have you ever: injected illegal drugs, used crack cocaine, lived in a homeless shelter  or been in jail or prison?     6. No Have you ever been exposed to anyone with infectious tuberculosis?    Tuberculosis Symptom Questionnaire  Do you currently have any of the following symptoms?  1. No Unexplained cough lasting more than 3 weeks?   2. No Unexplained fever lasting more than 3 weeks.   3. No Night Sweats (sweating that leaves the bedclothes and sheets wet)     4. No Shortness of Breath   5. No Chest Pain   6. No Unintentional weight loss    7. No Unexplained fatigue (very tired for no reason)

## 2015-05-12 NOTE — Patient Instructions (Addendum)
Stop your wellbutrin - it doesn't seem to be working.  Try to wean down/off your klonopin - it is counter-intuitive to use a sedative at the same time as a stimulant and I do think it is important to have time when you are not medicating your brain/body. We can expect that your symptoms will change over time - especially as the thyroid is treated. Keep an eye on your blood pressure and pulse. We may need to double your atenolol or consider adding in lisinopril as we discussed prior.  Attention Deficit Hyperactivity Disorder Attention deficit hyperactivity disorder (ADHD) is a problem with behavior issues based on the way the brain functions (neurobehavioral disorder). It is a common reason for behavior and academic problems in school. SYMPTOMS  There are 3 types of ADHD. The 3 types and some of the symptoms include:  Inattentive.  Gets bored or distracted easily.  Loses or forgets things. Forgets to hand in homework.  Has trouble organizing or completing tasks.  Difficulty staying on task.  An inability to organize daily tasks and school work.  Leaving projects, chores, or homework unfinished.  Trouble paying attention or responding to details. Careless mistakes.  Difficulty following directions. Often seems like is not listening.  Dislikes activities that require sustained attention (like chores or homework).  Hyperactive-impulsive.  Feels like it is impossible to sit still or stay in a seat. Fidgeting with hands and feet.  Trouble waiting turn.  Talking too much or out of turn. Interruptive.  Speaks or acts impulsively.  Aggressive, disruptive behavior.  Constantly busy or on the go; noisy.  Often leaves seat when they are expected to remain seated.  Often runs or climbs where it is not appropriate, or feels very restless.  Combined.  Has symptoms of both of the above. Often children with ADHD feel discouraged about themselves and with school. They often perform  well below their abilities in school. As children get older, the excess motor activities can calm down, but the problems with paying attention and staying organized persist. Most children do not outgrow ADHD but with good treatment can learn to cope with the symptoms. DIAGNOSIS  When ADHD is suspected, the diagnosis should be made by professionals trained in ADHD. This professional will collect information about the individual suspected of having ADHD. Information must be collected from various settings where the person lives, works, or attends school.  Diagnosis will include:  Confirming symptoms began in childhood.  Ruling out other reasons for the child's behavior.  The health care providers will check with the child's school and check their medical records.  They will talk to teachers and parents.  Behavior rating scales for the child will be filled out by those dealing with the child on a daily basis. A diagnosis is made only after all information has been considered. TREATMENT  Treatment usually includes behavioral treatment, tutoring or extra support in school, and stimulant medicines. Because of the way a person's brain works with ADHD, these medicines decrease impulsivity and hyperactivity and increase attention. This is different than how they would work in a person who does not have ADHD. Other medicines used include antidepressants and certain blood pressure medicines. Most experts agree that treatment for ADHD should address all aspects of the person's functioning. Along with medicines, treatment should include structured classroom management at school. Parents should reward good behavior, provide constant discipline, and set limits. Tutoring should be available for the child as needed. ADHD is a lifelong condition. If untreated,  the disorder can have long-term serious effects into adolescence and adulthood. HOME CARE INSTRUCTIONS   Often with ADHD there is a lot of frustration  among family members dealing with the condition. Blame and anger are also feelings that are common. In many cases, because the problem affects the family as a whole, the entire family may need help. A therapist can help the family find better ways to handle the disruptive behaviors of the person with ADHD and promote change. If the person with ADHD is young, most of the therapist's work is with the parents. Parents will learn techniques for coping with and improving their child's behavior. Sometimes only the child with the ADHD needs counseling. Your health care providers can help you make these decisions.  Children with ADHD may need help learning how to organize. Some helpful tips include:  Keep routines the same every day from wake-up time to bedtime. Schedule all activities, including homework and playtime. Keep the schedule in a place where the person with ADHD will often see it. Mark schedule changes as far in advance as possible.  Schedule outdoor and indoor recreation.  Have a place for everything and keep everything in its place. This includes clothing, backpacks, and school supplies.  Encourage writing down assignments and bringing home needed books. Work with your child's teachers for assistance in organizing school work.  Offer your child a well-balanced diet. Breakfast that includes a balance of whole grains, protein, and fruits or vegetables is especially important for school performance. Children should avoid drinks with caffeine including:  Soft drinks.  Coffee.  Tea.  However, some older children (adolescents) may find these drinks helpful in improving their attention. Because it can also be common for adolescents with ADHD to become addicted to caffeine, talk with your health care provider about what is a safe amount of caffeine intake for your child.  Children with ADHD need consistent rules that they can understand and follow. If rules are followed, give small rewards.  Children with ADHD often receive, and expect, criticism. Look for good behavior and praise it. Set realistic goals. Give clear instructions. Look for activities that can foster success and self-esteem. Make time for pleasant activities with your child. Give lots of affection.  Parents are their children's greatest advocates. Learn as much as possible about ADHD. This helps you become a stronger and better advocate for your child. It also helps you educate your child's teachers and instructors if they feel inadequate in these areas. Parent support groups are often helpful. A national group with local chapters is called Children and Adults with Attention Deficit Hyperactivity Disorder (CHADD). SEEK MEDICAL CARE IF:  Your child has repeated muscle twitches, cough, or speech outbursts.  Your child has sleep problems.  Your child has a marked loss of appetite.  Your child develops depression.  Your child has new or worsening behavioral problems.  Your child develops dizziness.  Your child has a racing heart.  Your child has stomach pains.  Your child develops headaches. SEEK IMMEDIATE MEDICAL CARE IF:  Your child has been diagnosed with depression or anxiety and the symptoms seem to be getting worse.  Your child has been depressed and suddenly appears to have increased energy or motivation.  You are worried that your child is having a bad reaction to a medication he or she is taking for ADHD.   This information is not intended to replace advice given to you by your health care provider. Make sure you discuss any  questions you have with your health care provider.   Document Released: 07/08/2002 Document Revised: 07/23/2013 Document Reviewed: 03/25/2013 Elsevier Interactive Patient Education 2016 [place]. Stimulant Use Disorder-Amphetamines Amphetamines are one of a group of powerful drugs known as stimulants. Amphetamines have a number of medical uses, including the treatment of a  daytime sleepiness disorder due to narcolepsy or sleep apnea, attention deficit hyperactivity disorder, and chronic fatigue syndrome. However, amphetamines also are often misused because of the effects they produce. These effects include:  A feeling of extreme pleasure (euphoria).  Alertness.  Increased attention.  High energy.  Loss of appetite for weight loss. Common street names for these drugs include speed and crank. Amphetamines are taken by mouth, crushed and snorted, or dissolved in water and injected. Stimulants are addictive because they activate regions of the brain that are responsible for producing both the pleasurable sensation of "reward" and psychological dependence. Together, these actions account for loss of control and the rapid development of drug dependence. This means you will become ill without the drug (withdrawal) and need to keep using it to function.  Stimulant use disorder is use of stimulants that disrupts your daily life. It disrupts relationships with family and friends and how you do your job. Amphetamines increase blood pressure and heart rate. Use can lead to heart attack or stroke. Use can also cause death from irregular heart rate, seizures, or dangerously high body temperature. SIGNS AND SYMPTOMS  Symptoms of stimulant use disorder with amphetamines include:  Use of amphetamines in larger amounts or over a longer period than intended.  Unsuccessful attempts to cut down or control amphetamine use.  A lot of time spent obtaining, using, or recovering from the effects of amphetamines.  A strong desire or urge to use amphetamines (craving).  Continued use of amphetamines in spite of major problems at work, school, or home because of use.  Continued use of amphetamines in spite of relationship problems because of use.  Giving up or cutting down on important life activities because of amphetamine use.  Use of amphetamines over and over in situations when  it is physically hazardous, such as driving a car.  Continued use of amphetamines in spite of a physical problem that is likely related to amphetamine use. Physical problems can include:  Unintended weight loss.  High blood pressure.  Chest pain.  Infections such as human immunodeficiency virus and hepatitis (from injecting amphetamines).  Continued use of amphetamines in spite of mental problems that are likely related to use. Mental problems can include:  Anxiety.  Sleep problems.  Schizophrenia-like symptoms.  Depression.  Bipolar mood swings.  Violent behavior.  Need to use more and more amphetamines to get the same effect, or lessened effect over time with use of the same amount (tolerance).  Having withdrawal symptoms when amphetamine use is stopped, or using amphetamines to reduce or avoid withdrawal symptoms. Withdrawal symptoms include:  Depressed mood.  Low energy or restlessness.  Bad dreams.  Too little or too much sleep.  Increased appetite. DIAGNOSIS  Stimulant use disorder is diagnosed by your health care provider. You may be asked questions about your amphetamine use and how it affects your life. A physical exam may be done. A drug screen may be ordered. You may be referred to a mental health professional. The diagnosis of stimulant use disorder requires two or more symptoms within 12 months. The type of stimulant use disorder you have depends on the number of signs and symptoms you have.  The type may be:  Mild. Two or three signs and symptoms.  Moderate. Four or five signs and symptoms.  Severe. Six or more signs and symptoms. TREATMENT  The treatment for most problems related to stimulant use disorder with amphetamines may be divided into two types:  Short-term medical treatment. This helps to preserve life and prevent or minimize damage from physical or mental problems related to use.  Long-term substance abuse treatment. This focuses on recovery  from use disorder. It is provided by mental health professionals who have training in substance use disorders. It is usually a combination of counseling, support groups, and nonaddictive medicines that can reduce cravings or block the effects of amphetamines. HOME CARE INSTRUCTIONS   Take medicines only as directed by your health care provider.  Identify the people and activities that trigger your amphetamine use and avoid them.  Keep all follow-up visits as directed by your health care provider. SEEK MEDICAL CARE IF:  Your symptoms get worse or you relapse.  You are not able to take medicines as directed. SEEK IMMEDIATE MEDICAL CARE IF:   You have serious thoughts about hurting yourself or others.  You have a seizure, chest pain, sudden weakness, or loss of speech or vision. [place] on Drug Abuse: motorcyclefax.com  Substance Abuse and Mental Health Services Administration: ktimeonline.com   This information is not intended to replace advice given to you by your health care provider. Make sure you discuss any questions you have with your health care provider.   Document Released: 07/12/2001 Document Revised: 08/08/2014 Document Reviewed: 07/31/2013 Elsevier Interactive Patient Education [employer].

## 2015-05-14 ENCOUNTER — Encounter (HOSPITAL_COMMUNITY)
Admission: RE | Admit: 2015-05-14 | Discharge: 2015-05-14 | Disposition: A | Discharge status: Home / Self Care | Payer: 59 | Source: Ambulatory Visit | Attending: Internal Medicine | Admitting: Internal Medicine

## 2015-05-14 DIAGNOSIS — R946 Abnormal results of thyroid function studies: Secondary | ICD-10-CM | POA: Insufficient documentation

## 2015-05-15 ENCOUNTER — Encounter (HOSPITAL_COMMUNITY)
Admission: RE | Admit: 2015-05-15 | Discharge: 2015-05-15 | Disposition: A | Discharge status: Home / Self Care | Payer: 59 | Source: Ambulatory Visit | Attending: Internal Medicine | Admitting: Internal Medicine

## 2015-05-15 DIAGNOSIS — R946 Abnormal results of thyroid function studies: Secondary | ICD-10-CM | POA: Diagnosis not present

## 2015-05-15 LAB — QUANTIFERON TB GOLD ASSAY (BLOOD)
Interferon Gamma Release Assay: NEGATIVE
Mitogen value: >10 IU/mL
Quantiferon Nil Value: 0.03 IU/mL
Quantiferon Tb Ag Minus Nil Value: 0 IU/mL
TB Ag value: 0.03 IU/mL

## 2015-05-15 MED ORDER — SODIUM IODIDE I 131 CAPSULE
0.0100 | Freq: Once | INTRAVENOUS | Status: AC | PRN
  Administered 2015-05-15: 0.01 via ORAL

## 2015-05-15 MED ORDER — SODIUM PERTECHNETATE TC 99M INJECTION: 10.0000 | Freq: Once | INTRAVENOUS | Status: DC | PRN

## 2015-05-18 ENCOUNTER — Other Ambulatory Visit: Payer: Self-pay | Admitting: Internal Medicine

## 2015-05-18 ENCOUNTER — Other Ambulatory Visit: Payer: Self-pay | Admitting: Family Medicine

## 2015-05-18 ENCOUNTER — Telehealth: Payer: Self-pay

## 2015-05-18 DIAGNOSIS — R7989 Other specified abnormal findings of blood chemistry: Secondary | ICD-10-CM

## 2015-05-18 NOTE — Telephone Encounter (Signed)
erin you are wonderful. Thank you

## 2015-05-18 NOTE — Telephone Encounter (Signed)
Pt called about TB Gold. Let her know it was neg and released to MyChart so she could print it

## 2015-05-20 ENCOUNTER — Telehealth: Payer: Self-pay | Admitting: Internal Medicine

## 2015-05-20 NOTE — Telephone Encounter (Signed)
Please read message below and advise.  

## 2015-05-20 NOTE — Telephone Encounter (Signed)
Pt calling because she is still breaking into profuse sweating is there anything else we can research or test for her

## 2015-05-21 NOTE — Telephone Encounter (Signed)
Called pt and advised her per Dr Arman Filter message below. Pt voiced understanding and scheduled appt with Dr Cruzita Lederer for next week.

## 2015-05-21 NOTE — Telephone Encounter (Signed)
She could be perimenopausal, we can check an [place] at next visit.

## 2015-05-26 ENCOUNTER — Ambulatory Visit: Payer: Self-pay | Admitting: Internal Medicine

## 2015-05-27 ENCOUNTER — Ambulatory Visit (INDEPENDENT_AMBULATORY_CARE_PROVIDER_SITE_OTHER): Payer: 59 | Admitting: Internal Medicine

## 2015-05-27 ENCOUNTER — Encounter: Payer: Self-pay | Admitting: Internal Medicine

## 2015-05-27 VITALS — BP 118/72 | HR 70 | Temp 98.3°F | Resp 12 | Wt 286.8 lb

## 2015-05-27 DIAGNOSIS — E042 Nontoxic multinodular goiter: Secondary | ICD-10-CM

## 2015-05-27 DIAGNOSIS — R946 Abnormal results of thyroid function studies: Secondary | ICD-10-CM | POA: Diagnosis not present

## 2015-05-27 DIAGNOSIS — R7989 Other specified abnormal findings of blood chemistry: Secondary | ICD-10-CM

## 2015-05-27 DIAGNOSIS — R61 Generalized hyperhidrosis: Secondary | ICD-10-CM | POA: Diagnosis not present

## 2015-05-27 NOTE — Progress Notes (Addendum)
Patient ID: Tracey Reed, female   DOB: [birth year], 44 y.o.   MRN: [id number]   HPI  Tracey Reed is a 44 y.o.-year-old female, returning for follow-up for a low TSH and thyroid nodules with neck compression sxs. Last visit 5 months ago.  I reviewed pt's thyroid tests: Lab Results  Component Value Date   TSH 0.051* 04/26/2015   TSH 0.358 12/18/2014   TSH 0.109* 10/02/2014   TSH 0.716 11/02/2012   TSH 0.606 11/01/2011   FREET4 0.98 12/18/2014    She has several thyroid nodules - thyroid U/S (03/12/2014) - thyroid not enlarged, largest nodules 1.1 cm.  We also checked a barium swallow study and this showed minimal external indentation on the esophagus.  After a TSH returned low at 0.051, we checked a thyroid uptake and scan on 05/14/2015, and this showed a normal uptake of 11.8% (10-30%) we discount consistent with multinodular gland.  She tells me she took Biotin (B vitamins) before the last TSH.  Pt complains of feeling nodules in neck (L side), + hoarseness, + improved dysphagia >> better with Zyrtec.  She c/o: - no fatigue - + weight gain - + excessive sweating/heat intolerance - no tremors  - no increased anxiety - no palpitations - no hyperdefecation; + constipation (IBS) - no hair loss - + low libido  Pt does have a FH of thyroid ds: niece. No FH of thyroid cancer. No h/o radiation tx to head or neck.  No seaweed or kelp, no recent contrast studies. No steroid use. No herbal supplements.   I reviewed her chart and she also has a history of IBS, HTN, HL, vit D def, anxiety, depression (seasonal). Since last visit, she started Adderall, Zyrtec. She stopped Wellbutrin and decreased Celexa dose to 10 mg.  ROS: Constitutional: See history of present illness Eyes: + blurry vision, no xerophthalmia ENT: no sore throat, see history of present illness Cardiovascular: no CP/SOB/palpitations/leg swelling Respiratory: no cough/no SOB/no  wheezing Gastrointestinal: no Heartburn, no N/no V/no D/+ C Musculoskeletal: no muscle/+ joint aches Skin: no rashes  Neurological: no tremors/numbness/tingling/dizziness, no headaches  I reviewed pt's medications, allergies, PMH, social hx, family hx, and changes were documented in the history of present illness. Otherwise, unchanged from my initial visit note.  Past Medical History  Diagnosis Date  . Hypertension   . Anxiety    Past Surgical History  Procedure Laterality Date  . Endometrial ablation    . Tubal ligation     tubal ligation  History   Social History  . Marital Status: Married    Spouse Name: N/A  . Number of Children: 1   Occupational History  .  CMA at [employer] .   Social History Main Topics  . Smoking status: Never Smoker   . Smokeless tobacco: Not on file  . Alcohol Use: Yes, socially, mixed drinks, 1-2 drinks every 2 months   . Drug Use: No   Current Outpatient Prescriptions on File Prior to Visit  Medication Sig Dispense Refill  . amphetamine-dextroamphetamine (ADDERALL XR) 20 MG 24 hr capsule Take 1 capsule (20 mg total) by mouth every morning. 30 capsule 0  . atenolol (TENORMIN) 25 MG tablet TAKE 1 TABLET BY MOUTH ONCE DAILY 30 tablet 3  . blood glucose meter kit and supplies KIT Dispense based on patient and insurance preference. Use up to four times daily as directed. (FOR ICD-9 250.00, 250.01). 1 each 0  . citalopram (CELEXA) 10 MG tablet Take 1 tablet (  10 mg total) by mouth daily. 30 tablet 5  . clonazePAM (KLONOPIN) 0.5 MG tablet Take 1 tablet (0.5 mg total) by mouth 3 times/day as needed-between meals & bedtime for anxiety. 30 tablet 3  . cyclobenzaprine (FLEXERIL) 10 MG tablet Take 1 tablet (10 mg total) by mouth 3 (three) times daily as needed. 60 tablet 2  . hydrochlorothiazide (HYDRODIURIL) 25 MG tablet Take 1 tablet (25 mg total) by mouth daily. 90 tablet 3  . hydrOXYzine (ATARAX/VISTARIL) 25 MG tablet Take 1 tablet (25 mg total)  by mouth every 4 (four) hours as needed for anxiety or itching. For itching and hives 60 tablet 5  . ibuprofen (ADVIL,MOTRIN) 800 MG tablet Take 1 tablet (800 mg total) by mouth every 8 (eight) hours as needed. 60 tablet 1  . levocetirizine (XYZAL) 5 MG tablet Take 1 tablet (5 mg total) by mouth every evening. 30 tablet 11  . lubiprostone (AMITIZA) 24 MCG capsule Take 1 capsule (24 mcg total) by mouth 2 (two) times daily with a meal. 60 capsule 5  . omeprazole (PRILOSEC) 20 MG capsule Take 1 capsule (20 mg total) by mouth daily. 30 capsule 11  . Vitamin D, Ergocalciferol, (DRISDOL) 50000 UNITS CAPS capsule Take 1 capsule (50,000 Units total) by mouth every 7 (seven) days. 30 capsule 2   No current facility-administered medications on file prior to visit.   Allergies  Allergen Reactions  . Clindamycin/Lincomycin Rash   Family History  Problem Relation Age of Onset  . Hypertension Mother   . Cancer Maternal Grandmother   . Thyroid disease Neg Hx    + hypertension in mother, grandmother, aunts  PE: BP 118/72 mmHg  Pulse 70  Temp(Src) 98.3 F (36.8 C) (Oral)  Resp 12  Wt 286 lb 12.8 oz (130.092 kg)  SpO2 95% Wt Readings from Last 3 Encounters:  05/27/15 286 lb 12.8 oz (130.092 kg)  05/12/15 293 lb 9.6 oz (133.176 kg)  04/26/15 291 lb 12.8 oz (132.36 kg)   Constitutional: Obese, in NAD Eyes: PERRLA, EOMI, no exophthalmos, no lid lag, no stare ENT: moist mucous membranes, no thyromegaly, no thyroid bruits, no cervical lymphadenopathy Cardiovascular: RRR, No MRG Respiratory: CTA B Gastrointestinal: abdomen soft, NT, ND, BS+ Musculoskeletal: no deformities, strength intact in all 4 Skin: moist, warm, no rashes Neurological: no tremor with outstretched hands, DTR normal in all 4  ASSESSMENT: 1. Low TSH  2. Multiple thyroid nodules - Thyroid U/S (12/24/2014): Right thyroid lobe: 4.2 x 1.3 x 1.7 cm. Diffusely heterogeneous thyroid parenchyma. There are multiple small hypoechoic  solid nodules centrally within the gland measuring less than 10 mm. There is a single ovoid hypoechoic solid nodule in the medial mid gland which measures 11 x 5 x 9 mm.  Left thyroid lobe: 4 x 1 x 2 cm. Diffusely heterogeneous thyroid parenchyma. At least 3 small hypoechoic solid nodules are present within the mid to lower gland measuring 5 and 6 mm in diameter.  Isthmus Thickness: 0.6 cm. No nodules visualized.  Lymphadenopathy None visualized.  IMPRESSION: 1. Diffusely heterogeneous thyroid parenchyma as can be seen in the setting of thyroiditis. 2. Small bilateral thyroid nodules measuring up to 1.1 cm on the right.   - Ba swallow (12/25/2014): Initially double-contrast barium swallow was performed. The mucosa of the esophagus is unremarkable. A single contrast study shows the swallowing mechanism to be normal. There is very minimal indentation of the left lower cervical esophagus which may be due to thyroid nodules. Esophageal peristalsis is normal.   No hiatal hernia is seen. There is moderate gastroesophageal reflux demonstrated. A barium pill was given at the end of the study which passed into the stomach without delay.  IMPRESSION: 1. Moderate gastroesophageal reflux. Barium pill passes into the stomach without delay. 2. Minimal indentation upon the lower cervical esophagus may be due to previously demonstrated thyroid nodules.  - Thyroid uptake and scan (05/15/2015): Mildly heterogeneous uptake within thyroid gland which is mildly enlarged. No measurable cold nodules are present.  24 hour I 131 uptake = 11.8% (normal 10-30%)  IMPRESSION: 1. Mild heterogeneity of uptake consistent with multinodular gland. 2. Normal 24 hour I 131 uptake.  PLAN:  1. Patient with a low TSH, without thyrotoxic sxs except heat intolerance. She has taken Biotin before last TSH and I suspect that his was the reason why TSH was low, especially since a thyroid Uptake and Scan was normal. -  I suggested that we check the TSH, fT3 and fT4 after she stops Biotin x 2 days - we may use a low dose MMI if TSH still low - I do not feel that we need to add beta blockers at this time, since she is not tachycardic, anxious, or tremulous  2. Thyroid nodules - I reviewed the report of her thyroid ultrasound obtained at [facility] (no images available) along with the patient. She has several small hypoechoic nodules bilaterally, with the largest one measuring 1.1 cm. The actual size of the thyroid is not large. We again discussed that it is very unlikely that the normal thyroid to cause significant compression on her esophagus, however she does feel neck compression symptoms. These sxs are better after addition of Zyrtec >> likely allergic origin of her dysphagia.  3. Sweating - recheck TFTs - check FSH - advised to try to come off PPI if the above w/u is negative, as this was associated with excess sweating  Component     Latest Ref Rng 05/29/2015  TSH     0.35 - 4.50 uIU/mL 0.65  Free T4     0.60 - 1.60 ng/dL 0.88  T3, Free     2.3 - 4.2 pg/mL 3.3  FSH      9.7   Msg sent: Dear Ms Bennett,  Great news: thyroid tests are normal, meaning that the B vitamin (Biotin) was to blame for the abnormal tests in the past. Please stay off Biotin x2 days before you have thyroid tests in the future.  You are not menopausal based on the FSH result.  I would suggest to try to come off the acid reflux medicine (you can try Zantac instead) to see if sweating improves. Make sure that you stay off the medicine for at least a month to have a chance to see and effect.  Sincerely,  Cristina Gherghe MD   Component     Latest Ref Rng 05/29/2015  TSI     <140 % baseline 73  TSI's not elevated.   

## 2015-05-27 NOTE — Patient Instructions (Signed)
Please stop the B complex 2 days before coming back for labs.  Please come back for a follow-up appointment in 6 months.

## 2015-05-29 ENCOUNTER — Other Ambulatory Visit (INDEPENDENT_AMBULATORY_CARE_PROVIDER_SITE_OTHER): Payer: 59

## 2015-05-29 DIAGNOSIS — R61 Generalized hyperhidrosis: Secondary | ICD-10-CM

## 2015-05-29 DIAGNOSIS — R946 Abnormal results of thyroid function studies: Secondary | ICD-10-CM | POA: Diagnosis not present

## 2015-05-29 DIAGNOSIS — R7989 Other specified abnormal findings of blood chemistry: Secondary | ICD-10-CM

## 2015-05-29 LAB — T3, FREE: T3, Free: 3.3 pg/mL (ref 2.3–4.2)

## 2015-05-29 LAB — FOLLICLE STIMULATING HORMONE: FSH: 9.7 m[IU]/mL

## 2015-05-29 LAB — TSH: TSH: 0.65 u[IU]/mL (ref 0.35–4.50)

## 2015-05-29 LAB — T4, FREE: Free T4: 0.88 ng/dL (ref 0.60–1.60)

## 2015-06-03 LAB — THYROID STIMULATING IMMUNOGLOBULIN: TSI: 73 % baseline (ref <140)

## 2015-07-02 ENCOUNTER — Telehealth: Payer: Self-pay

## 2015-07-02 NOTE — Telephone Encounter (Signed)
Wants to switch from adderall to something else.   [phone]

## 2015-07-03 NOTE — Telephone Encounter (Signed)
Left message for pt to call back.   I need to know why she wants to change.

## 2015-07-06 NOTE — Telephone Encounter (Signed)
Spoke with pt, she is having headaches and it is hard for her to fall asleep on the Adderall. Please advise.

## 2015-07-06 NOTE — Telephone Encounter (Signed)
Should come in for a visit

## 2015-07-06 NOTE — Telephone Encounter (Signed)
Advised pt to RTC on voicemail.

## 2015-07-06 NOTE — Telephone Encounter (Signed)
Left message for pt to call back  °

## 2015-07-07 ENCOUNTER — Telehealth: Payer: Self-pay

## 2015-07-07 NOTE — Telephone Encounter (Signed)
Dr. Brigitte Pulse, patient called regarding adderall refill. She is having headaches since being on this medication. She wants to know if the recheck is to see how she is doing on this medication. She does not have time to come in for a visit and she doesn't need to be seen she would just like the refills sent in. Cb# [phone].

## 2015-07-09 NOTE — Telephone Encounter (Signed)
Unable to reach Pt. Left message to call back

## 2015-07-09 NOTE — Telephone Encounter (Signed)
No, she is a new start on a controlled 2 substance that can have severe cardiac side effects and a huge addiction/abuse potential. She is always going to have to be seen in the office by me for any refills. Alternatively, she could establish with psych which would be the much better/preferrable choice.

## 2015-07-13 ENCOUNTER — Other Ambulatory Visit: Payer: Self-pay | Admitting: Family Medicine

## 2015-07-13 NOTE — Telephone Encounter (Signed)
Advised pt

## 2015-07-19 NOTE — Telephone Encounter (Signed)
Pt called again about refill for adderall.  I advised her that she has to come in for refills.  Pt will try and come in tomorrow to be seen.

## 2015-07-20 ENCOUNTER — Other Ambulatory Visit: Payer: Self-pay | Admitting: Family Medicine

## 2015-07-21 ENCOUNTER — Other Ambulatory Visit: Payer: Self-pay | Admitting: *Deleted

## 2015-07-21 MED ORDER — GLUCOSE BLOOD VI STRP: ORAL_STRIP | Status: DC

## 2015-07-30 ENCOUNTER — Ambulatory Visit (INDEPENDENT_AMBULATORY_CARE_PROVIDER_SITE_OTHER): Payer: 59 | Admitting: Emergency Medicine

## 2015-07-30 VITALS — BP 116/66 | HR 65 | Temp 97.9°F | Resp 18 | Ht 65.0 in | Wt 281.6 lb

## 2015-07-30 DIAGNOSIS — L0100 Impetigo, unspecified: Secondary | ICD-10-CM | POA: Diagnosis not present

## 2015-07-30 DIAGNOSIS — A084 Viral intestinal infection, unspecified: Secondary | ICD-10-CM

## 2015-07-30 DIAGNOSIS — F900 Attention-deficit hyperactivity disorder, predominantly inattentive type: Secondary | ICD-10-CM | POA: Diagnosis not present

## 2015-07-30 DIAGNOSIS — I10 Essential (primary) hypertension: Secondary | ICD-10-CM | POA: Diagnosis not present

## 2015-07-30 DIAGNOSIS — F988 Other specified behavioral and emotional disorders with onset usually occurring in childhood and adolescence: Secondary | ICD-10-CM

## 2015-07-30 MED ORDER — MUPIROCIN 2 % EX OINT: 1.0000 "application " | TOPICAL_OINTMENT | Freq: Three times a day (TID) | CUTANEOUS | Status: DC

## 2015-07-30 MED ORDER — ONDANSETRON 8 MG PO TBDP: 8.0000 mg | ORAL_TABLET | Freq: Three times a day (TID) | ORAL | Status: DC | PRN

## 2015-07-30 MED ORDER — SULFAMETHOXAZOLE-TRIMETHOPRIM 800-160 MG PO TABS: 1.0000 | ORAL_TABLET | Freq: Two times a day (BID) | ORAL | Status: DC

## 2015-07-30 MED ORDER — AMPHETAMINE-DEXTROAMPHET ER 20 MG PO CP24: 20.0000 mg | ORAL_CAPSULE | ORAL | Status: DC

## 2015-07-30 NOTE — Progress Notes (Signed)
Subjective:  Patient ID: Tracey Reed, female    DOB: Jun 03, 1971  Age: 44 y.o. MRN: [id number]  CC: Fatigue; Medication Refill; Nausea; dry heave; Chills; Labs Only; and Headache   HPI Tracey Reed presents  patient is a number of unrelated complaints. She claims illness since Monday with intermittent nausea and vomiting. She's had no diarrhea. She vomited once this morning. She had poor by mouth intake not eating well. She has no specific food intolerance. She's been chills earlier in the week. With no documented fever. She has a nonproductive cough. No nasal congestion postnasal drainage. She has no shortness breath or wheezing. She has no stool change. She's not tried any medication for symptoms.  She has a history of ADD and has been treated with Adderall XR and is tolerating medication well is controlling her symptoms of that. She denies any problems with medication. Her blood pressure than well controlled on her current medication regimen she has been having lab work done was mostly followed up from a previous visit and decided she would not have that done  History Madge has a past medical history of Hypertension and Anxiety.   She has past surgical history that includes Endometrial ablation and Tubal ligation.   Her  family history includes Cancer in her maternal grandmother; Hypertension in her mother. There is no history of Thyroid disease.  She   reports that she has never smoked. She does not have any smokeless tobacco history on file. She reports that she drinks alcohol. Her drug history is not on file.  Outpatient Prescriptions Prior to Visit  Medication Sig Dispense Refill  . atenolol (TENORMIN) 25 MG tablet TAKE 1 TABLET BY MOUTH ONCE DAILY 30 tablet 3  . blood glucose meter kit and supplies KIT Dispense based on patient and insurance preference. Use up to four times daily as directed. (FOR ICD-9 250.00, 250.01). 1 each 0  . citalopram (CELEXA)  10 MG tablet Take 1 tablet (10 mg total) by mouth daily. 30 tablet 5  . clonazePAM (KLONOPIN) 0.5 MG tablet Take 1 tablet (0.5 mg total) by mouth 3 times/day as needed-between meals & bedtime for anxiety. 30 tablet 3  . cyclobenzaprine (FLEXERIL) 10 MG tablet Take 1 tablet (10 mg total) by mouth 3 (three) times daily as needed. 60 tablet 2  . glucose blood test strip Use as instructed UP TO 4 TIMES A DAY 100 each 0  . hydrochlorothiazide (HYDRODIURIL) 25 MG tablet Take 1 tablet (25 mg total) by mouth daily. 90 tablet 3  . hydrOXYzine (ATARAX/VISTARIL) 25 MG tablet Take 1 tablet (25 mg total) by mouth every 4 (four) hours as needed for anxiety or itching. For itching and hives 60 tablet 5  . ibuprofen (ADVIL,MOTRIN) 800 MG tablet Take 1 tablet (800 mg total) by mouth every 8 (eight) hours as needed. 60 tablet 1  . levocetirizine (XYZAL) 5 MG tablet Take 1 tablet (5 mg total) by mouth every evening. 30 tablet 11  . lubiprostone (AMITIZA) 24 MCG capsule Take 1 capsule (24 mcg total) by mouth 2 (two) times daily with a meal. 60 capsule 5  . omeprazole (PRILOSEC) 20 MG capsule Take 1 capsule (20 mg total) by mouth daily. 30 capsule 11  . TRUEPLUS LANCETS 30G MISC USE 4 TIMES DAILY 100 each 0  . Vitamin D, Ergocalciferol, (DRISDOL) 50000 UNITS CAPS capsule Take 1 capsule (50,000 Units total) by mouth every 7 (seven) days. 30 capsule 2  . amphetamine-dextroamphetamine (ADDERALL XR) 20  MG 24 hr capsule Take 1 capsule (20 mg total) by mouth every morning. 30 capsule 0   No facility-administered medications prior to visit.    Social History   Social History  . Marital Status: Legally Separated    Spouse Name: N/A  . Number of Children: N/A  . Years of Education: N/A   Social History Main Topics  . Smoking status: Never Smoker   . Smokeless tobacco: None  . Alcohol Use: Yes     Comment: occasional  . Drug Use: None  . Sexual Activity: Not Asked   Other Topics Concern  . None   Social History  Narrative     Review of Systems  Constitutional: Positive for chills and fatigue. Negative for fever and appetite change.  HENT: Negative for congestion, ear pain, postnasal drip, sinus pressure and sore throat.   Eyes: Negative for pain and redness.  Respiratory: Positive for cough. Negative for shortness of breath and wheezing.   Cardiovascular: Negative for leg swelling.  Gastrointestinal: Positive for nausea and vomiting. Negative for abdominal pain, diarrhea, constipation and blood in stool.  Endocrine: Negative for polyuria.  Genitourinary: Negative for dysuria, urgency, frequency and flank pain.  Musculoskeletal: Negative for gait problem.  Skin: Negative for rash.  Neurological: Positive for headaches. Negative for weakness.  Psychiatric/Behavioral: Negative for confusion and decreased concentration. The patient is not nervous/anxious.     Objective:  BP 116/66 mmHg  Pulse 65  Temp(Src) 97.9 F (36.6 C) (Oral)  Resp 18  Ht 5' 5"  (1.651 m)  Wt 281 lb 9.6 oz (127.733 kg)  BMI 46.86 kg/m2  SpO2 98%  Physical Exam  Constitutional: She is oriented to person, place, and time. She appears well-developed and well-nourished. No distress.  HENT:  Head: Normocephalic and atraumatic.  Right Ear: External ear normal.  Left Ear: External ear normal.  Nose: Nose normal.  Eyes: Conjunctivae and EOM are normal. Pupils are equal, round, and reactive to light. No scleral icterus.  Neck: Normal range of motion. Neck supple. No tracheal deviation present.  Cardiovascular: Normal rate, regular rhythm and normal heart sounds.   Pulmonary/Chest: Effort normal. No respiratory distress. She has no wheezes. She has no rales.  Abdominal: She exhibits no mass. There is no tenderness. There is no rebound and no guarding.  Musculoskeletal: She exhibits no edema.  Lymphadenopathy:    She has no cervical adenopathy.  Neurological: She is alert and oriented to person, place, and time. Coordination  normal.  Skin: Skin is warm and dry. No rash noted.  Psychiatric: She has a normal mood and affect. Her behavior is normal.      Assessment & Plan:   Tracey Reed was seen today for fatigue, medication refill, nausea, dry heave, chills, labs only and headache.  Diagnoses and all orders for this visit:  ADD (attention deficit disorder) without hyperactivity  Morbid obesity due to excess calories ([place])  Essential hypertension  Viral gastroenteritis  Impetigo  Other orders -     amphetamine-dextroamphetamine (ADDERALL XR) 20 MG 24 hr capsule; Take 1 capsule (20 mg total) by mouth every morning. -     amphetamine-dextroamphetamine (ADDERALL XR) 20 MG 24 hr capsule; Take 1 capsule (20 mg total) by mouth every morning. May refill in 30 days -     amphetamine-dextroamphetamine (ADDERALL XR) 20 MG 24 hr capsule; Take 1 capsule (20 mg total) by mouth every morning. May refill in 60 days -     mupirocin ointment (BACTROBAN) 2 %; Apply  1 application topically 3 (three) times daily. -     sulfamethoxazole-trimethoprim (BACTRIM DS,SEPTRA DS) 800-160 MG tablet; Take 1 tablet by mouth 2 (two) times daily. -     ondansetron (ZOFRAN-ODT) 8 MG disintegrating tablet; Take 1 tablet (8 mg total) by mouth every 8 (eight) hours as needed for nausea.  I am having Ms. Reed start on amphetamine-dextroamphetamine, amphetamine-dextroamphetamine, mupirocin ointment, sulfamethoxazole-trimethoprim, and ondansetron. I am also having her maintain her Vitamin D (Ergocalciferol), levocetirizine, hydrOXYzine, clonazePAM, cyclobenzaprine, lubiprostone, ibuprofen, omeprazole, hydrochlorothiazide, citalopram, blood glucose meter kit and supplies, atenolol, TRUEPLUS LANCETS 30G, glucose blood, and amphetamine-dextroamphetamine.  Meds ordered this encounter  Medications  . amphetamine-dextroamphetamine (ADDERALL XR) 20 MG 24 hr capsule    Sig: Take 1 capsule (20 mg total) by mouth every morning.    Dispense:  30  capsule    Refill:  0  . amphetamine-dextroamphetamine (ADDERALL XR) 20 MG 24 hr capsule    Sig: Take 1 capsule (20 mg total) by mouth every morning. May refill in 30 days    Dispense:  30 capsule    Refill:  0  . amphetamine-dextroamphetamine (ADDERALL XR) 20 MG 24 hr capsule    Sig: Take 1 capsule (20 mg total) by mouth every morning. May refill in 60 days    Dispense:  30 capsule    Refill:  0  . mupirocin ointment (BACTROBAN) 2 %    Sig: Apply 1 application topically 3 (three) times daily.    Dispense:  22 g    Refill:  1  . sulfamethoxazole-trimethoprim (BACTRIM DS,SEPTRA DS) 800-160 MG tablet    Sig: Take 1 tablet by mouth 2 (two) times daily.    Dispense:  20 tablet    Refill:  0  . ondansetron (ZOFRAN-ODT) 8 MG disintegrating tablet    Sig: Take 1 tablet (8 mg total) by mouth every 8 (eight) hours as needed for nausea.    Dispense:  30 tablet    Refill:  0    Appropriate red flag conditions were discussed with the patient as well as actions that should be taken.  Patient expressed his understanding.  Follow-up: Return if symptoms worsen or fail to improve.  Roselee Culver, MD

## 2015-07-30 NOTE — Patient Instructions (Signed)
Impetigo, Adult Impetigo is an infection of the skin. It commonly occurs in young children, but it can also occur in adults. The infection causes itchy blisters and sores that produce brownish-yellow fluid. As the fluid dries, it forms a thick, honey-colored crust. These skin changes usually occur on the face but can also affect other areas of the body. Impetigo usually goes away in 7-10 days with treatment. CAUSES Impetigo is caused by two types of bacteria. It may be caused by staphylococci or streptococci bacteria. These bacteria cause impetigo when they get under the surface of the skin. This often happens after some damage to the skin, such as damage from:  Cuts, scrapes, or scratches.  Insect bites, especially when you scratch the area of a bite.  Chickenpox or other illnesses that cause open skin sores.  Nail biting or chewing. Impetigo is contagious and can spread easily from one person to another. This may occur through close skin contact or by sharing towels, clothing, or other items with a person who has the infection. RISK FACTORS Some things that can increase the risk of getting this infection include:  Playing sports that include skin-to-skin contact with others.  Having a skin condition with open sores.  Having many skin cuts or scrapes.  Living in an area that has high humidity levels.  Having poor hygiene.  Having high levels of staphylococci in your nose. SIGNS AND SYMPTOMS Impetigo usually starts out as small blisters, often on the face. The blisters then break open and turn into tiny sores (lesions) with a yellow crust. In some cases, the blisters cause itching or burning. With scratching, irritation, or lack of treatment, these small lesions may get larger. Scratching can also cause impetigo to spread to other parts of the body. The bacteria can get under the fingernails and spread when you touch another area of your skin. Other possible symptoms include:  Larger  blisters.  Pus.  Swollen lymph glands. DIAGNOSIS This condition is usually diagnosed during a physical exam. A skin sample or sample of fluid from a blister may be taken for lab tests that involve growing bacteria (culture test). This can help confirm the diagnosis or help determine the best treatment. TREATMENT Mild impetigo can be treated with prescription antibiotic cream. Oral antibiotic medicine may be used in more severe cases. Medicines for itching may also be used. HOME CARE INSTRUCTIONS  Take medicines only as directed by your health care provider.  To help prevent impetigo from spreading to other body areas:  Keep your fingernails short and clean.  Do not scratch the blisters or sores.  Cover infected areas, if necessary, to keep from scratching.  Gently wash the infected areas with antibiotic soap and water.  Soak crusted areas in warm, soapy water using antibiotic soap.  Gently rub the areas to remove crusts. Do not scrub.  Wash your hands often to avoid spreading this infection.  Stay home until you have used an antibiotic cream for 48 hours (2 days) or an oral antibiotic medicine for 24 hours (1 day). You should only return to work and activities with other people if your skin shows significant improvement. PREVENTION  To keep the infection from spreading:  Stay home until you have used an antibiotic cream for 48 hours or an oral antibiotic for 24 hours.  Wash your hands often.  Do not engage in skin-to-skin contact with other people while you have still have blisters.  Do not share towels, washcloths, or bedding with others   an antibiotic cream for 48 hours or an oral antibiotic for 24 hours.  · Wash your hands often.  · Do not engage in skin-to-skin contact with other people while you have still have blisters.  · Do not share towels, washcloths, or bedding with others while you have the infection.  SEEK MEDICAL CARE IF:  · You develop more blisters or sores despite treatment.  · Other family members get sores.  · Your skin sores are not improving after 48 hours of treatment.  · You have a fever.  SEEK IMMEDIATE MEDICAL CARE IF:  · You see spreading redness or swelling of the skin around your sores.  · You  see red streaks coming from your sores.  · You develop a sore throat.     This information is not intended to replace advice given to you by your health care provider. Make sure you discuss any questions you have with your health care provider.     Document Released: 08/08/2014 Document Reviewed: 08/08/2014  Elsevier Interactive Patient Education ©2016 [employer]

## 2015-08-17 MED FILL — clonazePAM 0.5 MG TABS: 0.5 | 10 days supply | Qty: 30 | Fill #1

## 2015-08-17 MED FILL — ATENOLOL 25 MG TABLET: 25 | 30 days supply | Qty: 30 | Fill #2

## 2015-09-01 MED FILL — CITALOPRAM HBR 10 MG TABLET: 10 | 30 days supply | Qty: 30 | Fill #2

## 2015-09-01 MED FILL — IBUPROFEN 800 MG TABLET: 800 | 20 days supply | Qty: 60 | Fill #1

## 2015-09-01 MED FILL — clonazePAM 0.5 MG TABS: 0.5 | 10 days supply | Qty: 30 | Fill #2

## 2015-09-01 MED FILL — LEVOCETIRIZINE 5 MG TABLET: 5 | 30 days supply | Qty: 30 | Fill #3

## 2015-09-01 MED FILL — DEXTROAMP-AMPHET ER 20 MG C: 20 | 30 days supply | Qty: 30 | Fill #0

## 2015-09-01 MED FILL — OMEPRAZOLE DR 20 MG CAPSULE: 20 | 30 days supply | Qty: 30 | Fill #2

## 2015-09-22 MED FILL — ATENOLOL 25 MG TABLET: 25 | 30 days supply | Qty: 30 | Fill #3

## 2015-09-22 MED FILL — HYDROCHLOROTHIAZIDE 25 MG T: 25 | 90 days supply | Qty: 90 | Fill #1

## 2015-11-02 MED FILL — DEXTROAMP-AMPHET ER 20 MG C: 20 | 30 days supply | Qty: 30 | Fill #0

## 2015-11-03 ENCOUNTER — Other Ambulatory Visit: Payer: Self-pay | Admitting: Family Medicine

## 2015-11-03 ENCOUNTER — Other Ambulatory Visit: Payer: Self-pay

## 2015-11-03 MED ORDER — MONTELUKAST SODIUM 10 MG PO TABS: 10.0000 mg | ORAL_TABLET | Freq: Every day | ORAL | Status: DC

## 2015-11-03 MED FILL — MONTELUKAST SOD 10 MG TAB: 10 | 30 days supply | Qty: 30 | Fill #0

## 2015-11-03 MED FILL — LEVOCETIRIZINE 5 MG TABLET: 5 | 30 days supply | Qty: 30 | Fill #4

## 2015-11-03 MED FILL — CITALOPRAM HBR 10 MG TABLET: 10 | 30 days supply | Qty: 30 | Fill #3

## 2015-11-03 MED FILL — CYCLOBENZAPRINE 10 MG TAB: 10 | 20 days supply | Qty: 60 | Fill #1

## 2015-11-04 ENCOUNTER — Other Ambulatory Visit: Payer: Self-pay

## 2015-11-04 NOTE — Telephone Encounter (Signed)
Pharm faxed req for RF of clonazepam. Pended.

## 2015-11-05 MED FILL — ATENOLOL 25 MG TABLET: 25 | 90 days supply | Qty: 90 | Fill #0

## 2015-11-11 ENCOUNTER — Encounter: Payer: Self-pay | Admitting: Family Medicine

## 2015-11-26 ENCOUNTER — Ambulatory Visit (INDEPENDENT_AMBULATORY_CARE_PROVIDER_SITE_OTHER): Payer: 59 | Admitting: Family Medicine

## 2015-11-26 VITALS — BP 126/82 | HR 76 | Temp 98.9°F | Resp 18 | Wt 273.0 lb

## 2015-11-26 DIAGNOSIS — K219 Gastro-esophageal reflux disease without esophagitis: Secondary | ICD-10-CM | POA: Diagnosis not present

## 2015-11-26 DIAGNOSIS — I10 Essential (primary) hypertension: Secondary | ICD-10-CM

## 2015-11-26 DIAGNOSIS — F4323 Adjustment disorder with mixed anxiety and depressed mood: Secondary | ICD-10-CM

## 2015-11-26 DIAGNOSIS — A4902 Methicillin resistant Staphylococcus aureus infection, unspecified site: Secondary | ICD-10-CM

## 2015-11-26 DIAGNOSIS — F901 Attention-deficit hyperactivity disorder, predominantly hyperactive type: Secondary | ICD-10-CM | POA: Diagnosis not present

## 2015-11-26 DIAGNOSIS — Z9109 Other allergy status, other than to drugs and biological substances: Secondary | ICD-10-CM

## 2015-11-26 DIAGNOSIS — R7303 Prediabetes: Secondary | ICD-10-CM

## 2015-11-26 DIAGNOSIS — Z91048 Other nonmedicinal substance allergy status: Secondary | ICD-10-CM

## 2015-11-26 DIAGNOSIS — R600 Localized edema: Secondary | ICD-10-CM

## 2015-11-26 DIAGNOSIS — E559 Vitamin D deficiency, unspecified: Secondary | ICD-10-CM

## 2015-11-26 DIAGNOSIS — E119 Type 2 diabetes mellitus without complications: Secondary | ICD-10-CM | POA: Insufficient documentation

## 2015-11-26 DIAGNOSIS — K589 Irritable bowel syndrome without diarrhea: Secondary | ICD-10-CM | POA: Diagnosis not present

## 2015-11-26 MED ORDER — TRUEPLUS LANCETS 30G MISC: Status: AC

## 2015-11-26 MED ORDER — LEVOCETIRIZINE DIHYDROCHLORIDE 5 MG PO TABS: 5.0000 mg | ORAL_TABLET | Freq: Every evening | ORAL | Status: DC

## 2015-11-26 MED ORDER — GLUCOSE BLOOD VI STRP: ORAL_STRIP | Status: DC

## 2015-11-26 MED ORDER — LUBIPROSTONE 24 MCG PO CAPS: 24.0000 ug | ORAL_CAPSULE | Freq: Two times a day (BID) | ORAL | Status: DC

## 2015-11-26 MED ORDER — ATENOLOL 25 MG PO TABS: 25.0000 mg | ORAL_TABLET | Freq: Every day | ORAL | Status: DC

## 2015-11-26 MED ORDER — BLOOD GLUCOSE MONITOR KIT: PACK | Status: DC

## 2015-11-26 MED ORDER — VITAMIN D (ERGOCALCIFEROL) 1.25 MG (50000 UNIT) PO CAPS: 50000.0000 [IU] | ORAL_CAPSULE | ORAL | Status: DC

## 2015-11-26 MED ORDER — OMEPRAZOLE 20 MG PO CPDR: 20.0000 mg | DELAYED_RELEASE_CAPSULE | Freq: Every day | ORAL | Status: DC

## 2015-11-26 MED ORDER — HYDROCHLOROTHIAZIDE 25 MG PO TABS: 25.0000 mg | ORAL_TABLET | Freq: Every day | ORAL | Status: DC

## 2015-11-26 MED ORDER — MUPIROCIN 2 % EX OINT: 1.0000 "application " | TOPICAL_OINTMENT | Freq: Three times a day (TID) | CUTANEOUS | Status: DC

## 2015-11-26 MED ORDER — HYDROXYZINE HCL 25 MG PO TABS: 25.0000 mg | ORAL_TABLET | ORAL | Status: DC | PRN

## 2015-11-26 MED ORDER — AMPHETAMINE-DEXTROAMPHET ER 20 MG PO CP24: 20.0000 mg | ORAL_CAPSULE | ORAL | Status: DC

## 2015-11-26 MED ORDER — CLONAZEPAM 0.5 MG PO TABS: 0.5000 mg | ORAL_TABLET | Freq: Two times a day (BID) | ORAL | Status: DC | PRN

## 2015-11-26 MED ORDER — CITALOPRAM HYDROBROMIDE 10 MG PO TABS: 10.0000 mg | ORAL_TABLET | Freq: Every day | ORAL | Status: DC

## 2015-11-26 MED ORDER — MONTELUKAST SODIUM 10 MG PO TABS
10.0000 mg | ORAL_TABLET | Freq: Every day | ORAL | Status: DC
Start: 2015-11-26 — End: 2016-08-08

## 2015-11-26 MED FILL — CYCLOBENZAPRINE 10 MG TAB: 10 | 20 days supply | Qty: 60 | Fill #2

## 2015-11-26 MED FILL — hydrOXYzine HCL 25 MG TABS: 25 | 10 days supply | Qty: 60 | Fill #2

## 2015-11-26 MED FILL — OMEPRAZOLE DR 20 MG CAPSULE: 20 | 30 days supply | Qty: 30 | Fill #3

## 2015-11-26 MED FILL — VIT D2 1.25 MG (50,000 UNIT: 1.25 MG | 84 days supply | Qty: 12 | Fill #2

## 2015-11-26 NOTE — Patient Instructions (Signed)
     IF you received an x-ray today, you will receive an invoice from [facility]. Please contact [facility] at [phone] with questions or concerns regarding your invoice.   IF you received labwork today, you will receive an invoice from [facility]. Please contact [facility] at [phone] with questions or concerns regarding your invoice.   Our billing staff will not be able to assist you with questions regarding bills from these companies.  You will be contacted with the lab results as soon as they are available. The fastest way to get your results is to activate your My Chart account. Instructions are located on the last page of this paperwork. If you have not heard from us regarding the results in 2 weeks, please contact this office.      

## 2015-11-26 NOTE — Progress Notes (Signed)
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  No new problems. Highest blood sugar to date 148.  Objective:BP 126/82 mmHg  Pulse 76  Temp(Src) 98.9 F (37.2 C) (Oral)  Resp 18  Wt 273 lb (123.832 kg)  SpO2 99% Neck:  No thyromegaly or adenop, supple HEENT:  Ulcer right nasolabial fold Chest:  Clear Heart: reg, no murmur General habitus:  obese   Assessment:  Doing well.   Vitamin D deficiency - Plan: Vitamin D, Ergocalciferol, (DRISDOL) 50000 units CAPS capsule  Prediabetes - Plan: TRUEPLUS LANCETS 30G MISC, glucose blood test strip, blood glucose meter kit and supplies KIT  Gastroesophageal reflux disease, esophagitis presence not specified - Plan: omeprazole (PRILOSEC) 20 MG capsule  MRSA (methicillin resistant Staphylococcus aureus) - Plan: mupirocin ointment (BACTROBAN) 2 %  Environmental allergies - Plan: montelukast (SINGULAIR) 10 MG tablet, levocetirizine (XYZAL) 5 MG tablet, hydrOXYzine (ATARAX/VISTARIL) 25 MG tablet  IBS (irritable bowel syndrome) - Plan: lubiprostone (AMITIZA) 24 MCG capsule  Pedal edema - Plan: hydrochlorothiazide (HYDRODIURIL) 25 MG tablet  Essential hypertension - Plan: hydrochlorothiazide (HYDRODIURIL) 25 MG tablet, atenolol (TENORMIN) 25 MG tablet  Adjustment disorder with mixed anxiety and depressed mood - Plan: clonazePAM (KLONOPIN) 0.5 MG tablet, citalopram (CELEXA) 10 MG tablet  Attention-deficit hyperactivity disorder, predominantly hyperactive type - Plan: amphetamine-dextroamphetamine (ADDERALL XR) 20 MG 24 hr capsule, DISCONTINUED: amphetamine-dextroamphetamine (ADDERALL XR) 20 MG 24 hr capsule, DISCONTINUED: amphetamine-dextroamphetamine (ADDERALL XR) 20 MG 24 hr capsule  Kurt Lauenstein, MD 

## 2015-11-27 ENCOUNTER — Ambulatory Visit (INDEPENDENT_AMBULATORY_CARE_PROVIDER_SITE_OTHER): Payer: 59 | Admitting: Internal Medicine

## 2015-11-27 ENCOUNTER — Encounter: Payer: Self-pay | Admitting: Internal Medicine

## 2015-11-27 VITALS — BP 118/80 | HR 85 | Temp 98.0°F | Resp 12 | Wt 271.6 lb

## 2015-11-27 DIAGNOSIS — E042 Nontoxic multinodular goiter: Secondary | ICD-10-CM

## 2015-11-27 DIAGNOSIS — R946 Abnormal results of thyroid function studies: Secondary | ICD-10-CM

## 2015-11-27 DIAGNOSIS — M25561 Pain in right knee: Secondary | ICD-10-CM

## 2015-11-27 DIAGNOSIS — R61 Generalized hyperhidrosis: Secondary | ICD-10-CM

## 2015-11-27 DIAGNOSIS — R7989 Other specified abnormal findings of blood chemistry: Secondary | ICD-10-CM

## 2015-11-27 LAB — T4, FREE: Free T4: 0.91 ng/dL (ref 0.60–1.60)

## 2015-11-27 LAB — T3, FREE: T3, Free: 3.7 pg/mL (ref 2.3–4.2)

## 2015-11-27 LAB — TSH: TSH: 0.71 u[IU]/mL (ref 0.35–4.50)

## 2015-11-27 NOTE — Patient Instructions (Addendum)
Try to stop Omeprazole and try Zantac instead.  Please discuss with Dr. Brigitte Pulse whether Ditropan can be used for excess sweating.  You can restart Biotin but stop 5 days before thyroid tests.  Please stop at the lab.  Please return in 1 year.

## 2015-11-27 NOTE — Progress Notes (Signed)
Patient ID: Tracey Reed, female   DOB: 30-Oct-1970, 45 y.o.   MRN: [id number]   HPI  [name] is a 45 y.o.-year-old female, returning for follow-up for a low TSH and thyroid nodules with neck compression sxs. Last visit 5 months ago.  Patient has a history of low TSH in the past:  After a TSH returned low at 0.051, we checked a thyroid uptake and scan on 05/14/2015, and this showed a normal uptake of 11.8% (10-30%) we discount consistent with multinodular gland.  She tells me she took Biotin (B vitamins) before the abnormal TSH levels >> stopped b/f lab draw >> TFTs normalized.  I reviewed pt's thyroid tests: Lab Results  Component Value Date   TSH 0.65 05/29/2015   TSH 0.051* 04/26/2015   TSH 0.358 12/18/2014   TSH 0.109* 10/02/2014   TSH 0.716 11/02/2012   TSH 0.606 11/01/2011   FREET4 0.88 05/29/2015   FREET4 0.98 12/18/2014    She has several thyroid nodules:  She had a thyroid U/S (03/12/2014) - thyroid not enlarged, largest nodules 1.1 cm.  We also checked a barium swallow study and this showed minimal external indentation on the esophagus possibly from the thyroid.  Pt mentions feeling nodules in neck (L side), + hoarseness, + improved dysphagia >> better with Zyrtec prn.  She c/o: - no fatigue - + weight loss (10 lbs in 4 months)  - in [place] - + excessive sweating/heat intolerance - no tremors  - no increased anxiety - no palpitations - no hyperdefecation; + constipation (IBS) - on  Amitiza - no hair loss  Pt does have a FH of thyroid ds: niece. No FH of thyroid cancer. No h/o radiation tx to head or neck.  No seaweed or kelp, no recent contrast studies. No steroid use. No herbal supplements.   I reviewed her chart and she also has a history of IBS, HTN, HL, vit D def, anxiety, depression (seasonal). Since last visit, she started Adderall. She is on decreased Celexa to 10 mg.  ROS: Constitutional: See history of present  illness Eyes: no blurry vision, no xerophthalmia ENT: no sore throat, see history of present illness Cardiovascular: no CP/SOB/palpitations/+ leg swelling Respiratory: no cough/no SOB/no wheezing Gastrointestinal: no Heartburn, no N/no V/no D/+ C Musculoskeletal: no muscle/+ joint aches Skin: no rashes  Neurological: no tremors/numbness/tingling/dizziness, + headaches  I reviewed pt's medications, allergies, PMH, social hx, family hx, and changes were documented in the history of present illness. Otherwise, unchanged from my initial visit note.  Past Medical History  Diagnosis Date  . Hypertension   . Anxiety    Past Surgical History  Procedure Laterality Date  . Endometrial ablation    . Tubal ligation     tubal ligation  History   Social History  . Marital Status: Married    Spouse Name: N/A  . Number of Children: 1   Occupational History  .  CMA at [employer] .   Social History Main Topics  . Smoking status: Never Smoker   . Smokeless tobacco: Not on file  . Alcohol Use: Yes, socially, mixed drinks, 1-2 drinks every 2 months   . Drug Use: No   Current Outpatient Prescriptions on File Prior to Visit  Medication Sig Dispense Refill  . amphetamine-dextroamphetamine (ADDERALL XR) 20 MG 24 hr capsule Take 1 capsule (20 mg total) by mouth every morning. 30 capsule 0  . atenolol (TENORMIN) 25 MG tablet Take 1 tablet (25 mg total) by  mouth daily. 90 tablet 3  . blood glucose meter kit and supplies KIT Dispense based on patient and insurance preference. Use up to four times daily as directed. (FOR ICD-9 250.00, 250.01). 1 each 0  . citalopram (CELEXA) 10 MG tablet Take 1 tablet (10 mg total) by mouth daily. 30 tablet 11  . clonazePAM (KLONOPIN) 0.5 MG tablet Take 1 tablet (0.5 mg total) by mouth 3 times/day as needed-between meals & bedtime for anxiety. 30 tablet 3  . cyclobenzaprine (FLEXERIL) 10 MG tablet Take 1 tablet (10 mg total) by mouth 3 (three) times daily as  needed. 60 tablet 2  . glucose blood test strip Use as instructed UP TO 4 TIMES A DAY 100 each 3  . hydrochlorothiazide (HYDRODIURIL) 25 MG tablet Take 1 tablet (25 mg total) by mouth daily. 90 tablet 3  . hydrOXYzine (ATARAX/VISTARIL) 25 MG tablet Take 1 tablet (25 mg total) by mouth every 4 (four) hours as needed for anxiety or itching. For itching and hives 60 tablet 5  . ibuprofen (ADVIL,MOTRIN) 800 MG tablet Take 1 tablet (800 mg total) by mouth every 8 (eight) hours as needed. 60 tablet 1  . levocetirizine (XYZAL) 5 MG tablet Take 1 tablet (5 mg total) by mouth every evening. 30 tablet 11  . lubiprostone (AMITIZA) 24 MCG capsule Take 1 capsule (24 mcg total) by mouth 2 (two) times daily with a meal. 60 capsule 5  . montelukast (SINGULAIR) 10 MG tablet Take 1 tablet (10 mg total) by mouth at bedtime. 90 tablet 3  . mupirocin ointment (BACTROBAN) 2 % Apply 1 application topically 3 (three) times daily. 22 g 1  . omeprazole (PRILOSEC) 20 MG capsule Take 1 capsule (20 mg total) by mouth daily. 30 capsule 11  . TRUEPLUS LANCETS 30G MISC USE 4 TIMES DAILY 100 each 3  . Vitamin D, Ergocalciferol, (DRISDOL) 50000 units CAPS capsule Take 1 capsule (50,000 Units total) by mouth every 7 (seven) days. 30 capsule 2   No current facility-administered medications on file prior to visit.   Allergies  Allergen Reactions  . Clindamycin/Lincomycin Rash   Family History  Problem Relation Age of Onset  . Hypertension Mother   . Cancer Maternal Grandmother   . Thyroid disease Neg Hx    + hypertension in mother, grandmother, aunts  PE: BP 118/80 mmHg  Pulse 85  Temp(Src) 98 F (36.7 C) (Oral)  Resp 12  Wt 271 lb 9.6 oz (123.197 kg)  SpO2 99% Body mass index is 45.2 kg/(m^2). Wt Readings from Last 3 Encounters:  11/27/15 271 lb 9.6 oz (123.197 kg)  11/26/15 273 lb (123.832 kg)  07/30/15 281 lb 9.6 oz (127.733 kg)   Constitutional: Obese, in NAD Eyes: PERRLA, EOMI, no exophthalmos, no lid lag,  no stare ENT: moist mucous membranes, no thyromegaly (L lobe fulness), no cervical lymphadenopathy Cardiovascular: RRR, No MRG Respiratory: CTA B Gastrointestinal: abdomen soft, NT, ND, BS+ Musculoskeletal: no deformities, strength intact in all 4 Skin: moist, warm, no rashes Neurological: no tremor with outstretched hands, DTR normal in all 4  ASSESSMENT: 1. H/o low TSH - likely 2/2 Biotin use  2. Multiple thyroid nodules - Thyroid U/S (12/24/2014): Right thyroid lobe: 4.2 x 1.3 x 1.7 cm. Diffusely heterogeneous thyroid parenchyma. There are multiple small hypoechoic solid nodules centrally within the gland measuring less than 10 mm. There is a single ovoid hypoechoic solid nodule in the medial mid gland which measures 11 x 5 x 9 mm.  Left thyroid lobe:  4 x 1 x 2 cm. Diffusely heterogeneous thyroid parenchyma. At least 3 small hypoechoic solid nodules are present within the mid to lower gland measuring 5 and 6 mm in diameter.  Isthmus Thickness: 0.6 cm. No nodules visualized.  Lymphadenopathy None visualized.  IMPRESSION: 1. Diffusely heterogeneous thyroid parenchyma as can be seen in the setting of thyroiditis. 2. Small bilateral thyroid nodules measuring up to 1.1 cm on the right.   - Ba swallow (12/25/2014): Initially double-contrast barium swallow was performed. The mucosa of the esophagus is unremarkable. A single contrast study shows the swallowing mechanism to be normal. There is very minimal indentation of the left lower cervical esophagus which may be due to thyroid nodules. Esophageal peristalsis is normal. No hiatal hernia is seen. There is moderate gastroesophageal reflux demonstrated. A barium pill was given at the end of the study which passed into the stomach without delay.  IMPRESSION: 1. Moderate gastroesophageal reflux. Barium pill passes into the stomach without delay. 2. Minimal indentation upon the lower cervical esophagus may be due to previously  demonstrated thyroid nodules.  - Thyroid uptake and scan (05/15/2015): Mildly heterogeneous uptake within thyroid gland which is mildly enlarged. No measurable cold nodules are present.  24 hour I 131 uptake = 11.8% (normal 10-30%)  IMPRESSION: 1. Mild heterogeneity of uptake consistent with multinodular gland. 2. Normal 24 hour I 131 uptake.  3. Excess sweating  PLAN:  1. Patient with h/o  low TSH, without thyrotoxic sxs except heat intolerance. She has taken Biotin before she had TSH levels drawn in the past and I suspect that his was the reason why TSH was low, especially since a thyroid Uptake and Scan was normal.  - I suggested that we check the TSH, fT3 and fT4. She is off Biotin since last visit >> advised her that she can restart but hold it 5 days before blood draw for TFTs from now on. - we may use a low dose MMI if TSH still low  2. Thyroid nodules - I again reviewed the report of her thyroid ultrasound obtained at [facility] (no images available) along with the patient. She has several small hypoechoic nodules bilaterally, with the largest one measuring 1.1 cm. The actual size of the thyroid is not large. We again discussed that it is very unlikely that the normal thyroid to cause significant compression on her esophagus, however she does feel neck compression symptoms. These sxs are much better after addition of Zyrtec >> likely allergic origin of her dysphagia. - I plan to repeat another ultrasound next year  3. Excess sweating - advised to try to stop Omeprazole and switch to Zantac - if this not working or if GERD worsens >> maybe try Ditropan (or Levsin), but I advised her to d/w PCP about whether this would be feasible for her  Office Visit on 11/27/2015  Component Date Value Ref Range Status  . T3, Free 11/27/2015 3.7  2.3 - 4.2 pg/mL Final  . Free T4 11/27/2015 0.91  0.60 - 1.60 ng/dL Final  . TSH 11/27/2015 0.71  0.35 - 4.50 uIU/mL Final   TFTs  remain normal.

## 2015-12-03 MED FILL — clonazePAM 0.5 MG TABS: 0.5 | 10 days supply | Qty: 30 | Fill #0

## 2015-12-15 ENCOUNTER — Encounter (HOSPITAL_COMMUNITY): Payer: Self-pay | Admitting: Emergency Medicine

## 2015-12-15 ENCOUNTER — Emergency Department (HOSPITAL_COMMUNITY)
Admission: EM | Admit: 2015-12-15 | Discharge: 2015-12-15 | Disposition: A | Discharge status: Home / Self Care | Payer: 59 | Attending: Emergency Medicine | Admitting: Emergency Medicine

## 2015-12-15 DIAGNOSIS — Z79899 Other long term (current) drug therapy: Secondary | ICD-10-CM | POA: Diagnosis not present

## 2015-12-15 DIAGNOSIS — Z792 Long term (current) use of antibiotics: Secondary | ICD-10-CM | POA: Diagnosis not present

## 2015-12-15 DIAGNOSIS — F419 Anxiety disorder, unspecified: Secondary | ICD-10-CM | POA: Diagnosis not present

## 2015-12-15 DIAGNOSIS — R1084 Generalized abdominal pain: Secondary | ICD-10-CM | POA: Insufficient documentation

## 2015-12-15 DIAGNOSIS — R112 Nausea with vomiting, unspecified: Secondary | ICD-10-CM | POA: Insufficient documentation

## 2015-12-15 DIAGNOSIS — R197 Diarrhea, unspecified: Secondary | ICD-10-CM | POA: Insufficient documentation

## 2015-12-15 DIAGNOSIS — Z3202 Encounter for pregnancy test, result negative: Secondary | ICD-10-CM | POA: Diagnosis not present

## 2015-12-15 DIAGNOSIS — I10 Essential (primary) hypertension: Secondary | ICD-10-CM | POA: Insufficient documentation

## 2015-12-15 LAB — URINE MICROSCOPIC-ADD ON: RBC / HPF: NONE SEEN RBC/hpf (ref 0–5)

## 2015-12-15 LAB — COMPREHENSIVE METABOLIC PANEL
ALT: 19 U/L (ref 14–54)
AST: 19 U/L (ref 15–41)
Albumin: 3.6 g/dL (ref 3.5–5.0)
Alkaline Phosphatase: 68 U/L (ref 38–126)
Anion gap: 15 (ref 5–15)
BUN: 11 mg/dL (ref 6–20)
CO2: 22 mmol/L (ref 22–32)
Calcium: 9.9 mg/dL (ref 8.9–10.3)
Chloride: 100 mmol/L — ABNORMAL LOW (ref 101–111)
Creatinine, Ser: 0.95 mg/dL (ref 0.44–1.00)
GFR calc Af Amer: >60 mL/min (ref >60)
GFR calc non Af Amer: >60 mL/min (ref >60)
Glucose, Bld: 155 mg/dL — ABNORMAL HIGH (ref 65–99)
Potassium: 3.2 mmol/L — ABNORMAL LOW (ref 3.5–5.1)
Sodium: 137 mmol/L (ref 135–145)
Total Bilirubin: 0.6 mg/dL (ref 0.3–1.2)
Total Protein: 8.2 g/dL — ABNORMAL HIGH (ref 6.5–8.1)

## 2015-12-15 LAB — URINALYSIS, ROUTINE W REFLEX MICROSCOPIC
Bilirubin Urine: NEGATIVE
Glucose, UA: NEGATIVE mg/dL
Hgb urine dipstick: NEGATIVE
Ketones, ur: 15 mg/dL — AB
Leukocytes, UA: NEGATIVE
Nitrite: NEGATIVE
Protein, ur: 30 mg/dL — AB
Specific Gravity, Urine: 1.027 (ref 1.005–1.030)
pH: 5.5 (ref 5.0–8.0)

## 2015-12-15 LAB — CBC
HCT: 39.9 % (ref 36.0–46.0)
Hemoglobin: 13.2 g/dL (ref 12.0–15.0)
MCH: 26.3 pg (ref 26.0–34.0)
MCHC: 33.1 g/dL (ref 30.0–36.0)
MCV: 79.6 fL (ref 78.0–100.0)
Platelets: 334 10*3/uL (ref 150–400)
RBC: 5.01 MIL/uL (ref 3.87–5.11)
RDW: 15.3 % (ref 11.5–15.5)
WBC: 10.8 10*3/uL — ABNORMAL HIGH (ref 4.0–10.5)

## 2015-12-15 LAB — I-STAT BETA HCG BLOOD, ED (MC, WL, AP ONLY): I-stat hCG, quantitative: <5 m[IU]/mL (ref <5)

## 2015-12-15 LAB — LIPASE, BLOOD: Lipase: 20 U/L (ref 11–51)

## 2015-12-15 MED ORDER — LOPERAMIDE HCL 2 MG PO CAPS: 2.0000 mg | ORAL_CAPSULE | Freq: Four times a day (QID) | ORAL | Status: DC | PRN

## 2015-12-15 MED ORDER — DICYCLOMINE HCL 10 MG PO CAPS
10.0000 mg | ORAL_CAPSULE | Freq: Once | ORAL | Status: AC
  Administered 2015-12-15: 10 mg via ORAL
  Filled 2015-12-15: qty 1

## 2015-12-15 MED ORDER — ONDANSETRON HCL 4 MG PO TABS: 4.0000 mg | ORAL_TABLET | Freq: Four times a day (QID) | ORAL | Status: DC

## 2015-12-15 NOTE — ED Provider Notes (Signed)
CSN: [id number]     Arrival date & time 12/15/15  0555 History   First MD Initiated Contact with Patient 12/15/15 [id number]     Chief Complaint  Patient presents with  . Emesis  . Diarrhea  . Abdominal Pain     (Consider location/radiation/quality/duration/timing/severity/associated sxs/prior Treatment) HPI Tracey Reed is a 45 y.o. female reports a history of IBS, comes in for evaluation of nausea, vomiting and diarrhea. Patient symptoms started after she was at an all you can eat buffet on Sunday. States she took some of her IBS medication which seemed to help. She reports nausea, vomiting and diarrhea since that time. She reports her emesis as "straight bile". She reports her stool was originally bloody, but that has resolved. Mild, diffuse abdominal discomfort. Denies any hospital admission to antibiotics. No fevers, chills, urinary symptoms. Nothing else makes the problem better or worse. No menstrual period Since 2005, endometrial ablation.  Past Medical History  Diagnosis Date  . Hypertension   . Anxiety    Past Surgical History  Procedure Laterality Date  . Endometrial ablation    . Tubal ligation     Family History  Problem Relation Age of Onset  . Hypertension Mother   . Cancer Maternal Grandmother   . Thyroid disease Neg Hx    Social History  Substance Use Topics  . Smoking status: Never Smoker   . Smokeless tobacco: None  . Alcohol Use: Yes     Comment: occasional   OB History    No data available     Review of Systems A 10 point review of systems was completed and was negative except for pertinent positives and negatives as mentioned in the history of present illness     Allergies  Clindamycin/lincomycin  Home Medications   Prior to Admission medications   Medication Sig Start Date End Date Taking? Authorizing Provider  amphetamine-dextroamphetamine (ADDERALL XR) 20 MG 24 hr capsule Take 1 capsule (20 mg total) by mouth every morning. 11/26/15   Yes Robyn Haber, MD  atenolol (TENORMIN) 25 MG tablet Take 1 tablet (25 mg total) by mouth daily. 11/26/15  Yes Robyn Haber, MD  blood glucose meter kit and supplies KIT Dispense based on patient and insurance preference. Use up to four times daily as directed. (FOR ICD-9 250.00, 250.01). 11/26/15  Yes Robyn Haber, MD  citalopram (CELEXA) 10 MG tablet Take 1 tablet (10 mg total) by mouth daily. 11/26/15  Yes Robyn Haber, MD  clonazePAM (KLONOPIN) 0.5 MG tablet Take 1 tablet (0.5 mg total) by mouth 3 times/day as needed-between meals & bedtime for anxiety. 11/26/15  Yes Robyn Haber, MD  cyclobenzaprine (FLEXERIL) 10 MG tablet Take 1 tablet (10 mg total) by mouth 3 (three) times daily as needed. 04/26/15  Yes Shawnee Knapp, MD  hydrochlorothiazide (HYDRODIURIL) 25 MG tablet Take 1 tablet (25 mg total) by mouth daily. 11/26/15  Yes Robyn Haber, MD  hydrOXYzine (ATARAX/VISTARIL) 25 MG tablet Take 1 tablet (25 mg total) by mouth every 4 (four) hours as needed for anxiety or itching. For itching and hives 11/26/15  Yes Robyn Haber, MD  ibuprofen (ADVIL,MOTRIN) 800 MG tablet Take 1 tablet (800 mg total) by mouth every 8 (eight) hours as needed. 04/26/15  Yes Shawnee Knapp, MD  levocetirizine (XYZAL) 5 MG tablet Take 1 tablet (5 mg total) by mouth every evening. 11/26/15  Yes Robyn Haber, MD  lubiprostone (AMITIZA) 24 MCG capsule Take 1 capsule (24 mcg total) by mouth 2 (two)  times daily with a meal. 11/26/15  Yes Robyn Haber, MD  montelukast (SINGULAIR) 10 MG tablet Take 1 tablet (10 mg total) by mouth at bedtime. 11/26/15  Yes Robyn Haber, MD  mupirocin ointment (BACTROBAN) 2 % Apply 1 application topically 3 (three) times daily. 11/26/15  Yes Robyn Haber, MD  omeprazole (PRILOSEC) 20 MG capsule Take 1 capsule (20 mg total) by mouth daily. 11/26/15  Yes Robyn Haber, MD  TRUEPLUS LANCETS 30G MISC USE 4 TIMES DAILY 11/26/15  Yes Robyn Haber, MD  Vitamin D, Ergocalciferol,  (DRISDOL) 50000 units CAPS capsule Take 1 capsule (50,000 Units total) by mouth every 7 (seven) days. 11/26/15  Yes Robyn Haber, MD  glucose blood test strip Use as instructed UP TO 4 TIMES A DAY 11/26/15   Robyn Haber, MD  loperamide (IMODIUM) 2 MG capsule Take 1 capsule (2 mg total) by mouth 4 (four) times daily as needed for diarrhea or loose stools. 12/15/15   Comer Locket, PA-C  ondansetron (ZOFRAN) 4 MG tablet Take 1 tablet (4 mg total) by mouth every 6 (six) hours. 12/15/15   Comer Locket, PA-C   BP 137/83 mmHg  Pulse 79  Temp(Src) 97.9 F (36.6 C) (Oral)  Resp 18  Ht 5' 5"  (1.651 m)  Wt 121.564 kg  BMI 44.60 kg/m2  SpO2 97% Physical Exam  Constitutional: She is oriented to person, place, and time. She appears well-developed and well-nourished.  HENT:  Head: Normocephalic and atraumatic.  Mouth/Throat: Oropharynx is clear and moist.  Eyes: Conjunctivae are normal. Pupils are equal, round, and reactive to light. Right eye exhibits no discharge. Left eye exhibits no discharge. No scleral icterus.  Neck: Neck supple.  Cardiovascular: Normal rate, regular rhythm and normal heart sounds.   Pulmonary/Chest: Effort normal and breath sounds normal. No respiratory distress. She has no wheezes. She has no rales.  Abdominal: Soft. She exhibits no distension and no mass. There is no tenderness. There is no rebound and no guarding.  Musculoskeletal: She exhibits no tenderness.  Neurological: She is alert and oriented to person, place, and time.  Cranial Nerves II-XII grossly intact  Skin: Skin is warm and dry. No rash noted.  Psychiatric: She has a normal mood and affect.  Nursing note and vitals reviewed.   ED Course  Procedures (including critical care time) Labs Review Labs Reviewed  COMPREHENSIVE METABOLIC PANEL - Abnormal; Notable for the following:    Potassium 3.2 (*)    Chloride 100 (*)    Glucose, Bld 155 (*)    Total Protein 8.2 (*)    All other components  within normal limits  CBC - Abnormal; Notable for the following:    WBC 10.8 (*)    All other components within normal limits  URINALYSIS, ROUTINE W REFLEX MICROSCOPIC (NOT AT [facility]) - Abnormal; Notable for the following:    Ketones, ur 15 (*)    Protein, ur 30 (*)    All other components within normal limits  URINE MICROSCOPIC-ADD ON - Abnormal; Notable for the following:    Squamous Epithelial / LPF 6-30 (*)    Bacteria, UA FEW (*)    All other components within normal limits  LIPASE, BLOOD  I-STAT BETA HCG BLOOD, ED ([facility], [facility], [facility] ONLY)    Imaging Review No results found. I have personally reviewed and evaluated these images and lab results as part of my medical decision-making.   EKG Interpretation None     Filed Vitals:   12/15/15 0555 12/15/15 0602 12/15/15  0743  BP:  143/91 137/83  Pulse:  63 79  Temp:  97.9 F (36.6 C)   TempSrc:  Oral   Resp:  18 18  Height:  5' 5"  (1.651 m)   Weight:  121.564 kg   SpO2: 99% 95% 97%   Meds given in ED:  Medications  dicyclomine (BENTYL) capsule 10 mg (not administered)    New Prescriptions   LOPERAMIDE (IMODIUM) 2 MG CAPSULE    Take 1 capsule (2 mg total) by mouth 4 (four) times daily as needed for diarrhea or loose stools.   ONDANSETRON (ZOFRAN) 4 MG TABLET    Take 1 tablet (4 mg total) by mouth every 6 (six) hours.    MDM  Tracey Reed is a 45 y.o. female with reported history of IBS, reportedly followed by gastroenterology-Dr. Benson Norway, here for evaluation of nausea, vomiting and diarrhea after eating at all you can eat buffet. On arrival, she is hemodynamically stable, afebrile and in no apparent distress. Physical exam is unremarkable, abdominal exam is reassuring-no peritoneal signs or other evidence of acute or surgical abdomen. No vomiting or diarrhea and emergency department. Patient is tolerating oral fluids easily. Patient has mild leukocytosis at 10.8, no evidence of infection, likely nonspecific. Labs are  otherwise unremarkable and baseline for patient. Given Bentyl, Zofran and loperamide. Encourage follow-up with her gastroenterologist Final diagnoses:  Nausea vomiting and diarrhea        Comer Locket, PA-C 12/15/15 0737  Everlene Balls, MD 12/15/15 1256

## 2015-12-15 NOTE — Discharge Instructions (Signed)
There does not appear to be an emergent cause for her symptoms at this time. Your exam and labs were very reassuring. Please take your Zofran for nausea and loperamide for diarrhea as we discussed. Follow up with your doctor in the next 2-3 days for reevaluation. Return to ED for any new or worsening symptoms as we discussed  Diet for Irritable Bowel Syndrome When you have irritable bowel syndrome (IBS), the foods you eat and your eating habits are very important. IBS may cause various symptoms, such as abdominal pain, constipation, or diarrhea. Choosing the right foods can help ease discomfort caused by these symptoms. Work with your health care provider and dietitian to find the best eating plan to help control your symptoms. WHAT GENERAL GUIDELINES DO I NEED TO FOLLOW?  Keep a food diary. This will help you identify foods that cause symptoms. Write down:  What you eat and when.  What symptoms you have.  When symptoms occur in relation to your meals.  Avoid foods that cause symptoms. Talk with your dietitian about other ways to get the same nutrients that are in these foods.  Eat more foods that contain fiber. Take a fiber supplement if directed by your dietitian.  Eat your meals slowly, in a relaxed setting.  Aim to eat 5-6 small meals per day. Do not skip meals.  Drink enough fluids to keep your urine clear or pale yellow.  Ask your health care provider if you should take an over-the-counter probiotic during flare-ups to help restore healthy gut bacteria.  If you have cramping or diarrhea, try making your meals low in fat and high in carbohydrates. Examples of carbohydrates are pasta, rice, whole grain breads and cereals, fruits, and vegetables.  If dairy products cause your symptoms to flare up, try eating less of them. You might be able to handle yogurt better than other dairy products because it contains bacteria that help with digestion. WHAT FOODS ARE NOT RECOMMENDED? The  following are some foods and drinks that may worsen your symptoms:  Fatty foods, such as [place] fries.  Milk products, such as cheese or ice cream.  Chocolate.  Alcohol.  Products with caffeine, such as coffee.  Carbonated drinks, such as soda. The items listed above may not be a complete list of foods and beverages to avoid. Contact your dietitian for more information. WHAT FOODS ARE GOOD SOURCES OF FIBER? Your health care provider or dietitian may recommend that you eat more foods that contain fiber. Fiber can help reduce constipation and other IBS symptoms. Add foods with fiber to your diet a little at a time so that your body can get used to them. Too much fiber at once might cause gas and swelling of your abdomen. The following are some foods that are good sources of fiber:  Apples.  Peaches.  Pears.  Berries.  Figs.  Broccoli (raw).  Cabbage.  Carrots.  Raw peas.  Kidney beans.  Lima beans.  Whole grain bread.  Whole grain cereal. FOR MORE INFORMATION  International Foundation for Functional Gastrointestinal Disorders: www.iffgd.[employer] of Diabetes and Digestive and Kidney Diseases: NetworkAffair.co.za.aspx   This information is not intended to replace advice given to you by your health care provider. Make sure you discuss any questions you have with your health care provider.   Document Released: 10/08/2003 Document Revised: 08/08/2014 Document Reviewed: 10/18/2013 Elsevier Interactive Patient Education 2016 Elsevier Inc.  Nausea and Vomiting Nausea is a sick feeling that often comes before throwing up (  vomiting). Vomiting is a reflex where stomach contents come out of your mouth. Vomiting can cause severe loss of body fluids (dehydration). Children and elderly adults can become dehydrated quickly, especially if they also have diarrhea. Nausea and vomiting are symptoms of a  condition or disease. It is important to find the cause of your symptoms. CAUSES   Direct irritation of the stomach lining. This irritation can result from increased acid production (gastroesophageal reflux disease), infection, food poisoning, taking certain medicines (such as nonsteroidal anti-inflammatory drugs), alcohol use, or tobacco use.  Signals from the brain.These signals could be caused by a headache, heat exposure, an inner ear disturbance, increased pressure in the brain from injury, infection, a tumor, or a concussion, pain, emotional stimulus, or metabolic problems.  An obstruction in the gastrointestinal tract (bowel obstruction).  Illnesses such as diabetes, hepatitis, gallbladder problems, appendicitis, kidney problems, cancer, sepsis, atypical symptoms of a heart attack, or eating disorders.  Medical treatments such as chemotherapy and radiation.  Receiving medicine that makes you sleep (general anesthetic) during surgery. DIAGNOSIS Your caregiver may ask for tests to be done if the problems do not improve after a few days. Tests may also be done if symptoms are severe or if the reason for the nausea and vomiting is not clear. Tests may include:  Urine tests.  Blood tests.  Stool tests.  Cultures (to look for evidence of infection).  X-rays or other imaging studies. Test results can help your caregiver make decisions about treatment or the need for additional tests. TREATMENT You need to stay well hydrated. Drink frequently but in small amounts.You may wish to drink water, sports drinks, clear broth, or eat frozen ice pops or gelatin dessert to help stay hydrated.When you eat, eating slowly may help prevent nausea.There are also some antinausea medicines that may help prevent nausea. HOME CARE INSTRUCTIONS   Take all medicine as directed by your caregiver.  If you do not have an appetite, do not force yourself to eat. However, you must continue to drink  fluids.  If you have an appetite, eat a normal diet unless your caregiver tells you differently.  Eat a variety of complex carbohydrates (rice, wheat, potatoes, bread), lean meats, yogurt, fruits, and vegetables.  Avoid high-fat foods because they are more difficult to digest.  Drink enough water and fluids to keep your urine clear or pale yellow.  If you are dehydrated, ask your caregiver for specific rehydration instructions. Signs of dehydration may include:  Severe thirst.  Dry lips and mouth.  Dizziness.  Dark urine.  Decreasing urine frequency and amount.  Confusion.  Rapid breathing or pulse. SEEK IMMEDIATE MEDICAL CARE IF:   You have blood or brown flecks (like coffee grounds) in your vomit.  You have black or bloody stools.  You have a severe headache or stiff neck.  You are confused.  You have severe abdominal pain.  You have chest pain or trouble breathing.  You do not urinate at least once every 8 hours.  You develop cold or clammy skin.  You continue to vomit for longer than 24 to 48 hours.  You have a fever. MAKE SURE YOU:   Understand these instructions.  Will watch your condition.  Will get help right away if you are not doing well or get worse.   This information is not intended to replace advice given to you by your health care provider. Make sure you discuss any questions you have with your health care provider.  Document Released: 07/18/2005 Document Revised: 10/10/2011 Document Reviewed: 12/15/2010 Elsevier Interactive Patient Education [employer].

## 2015-12-15 NOTE — ED Notes (Signed)
Phlebotomy at bedside.

## 2015-12-15 NOTE — ED Notes (Signed)
Pt in EMS from home. Reports N/V/D with generalized abd pain present since yesterday. Received 4 Zofran IV, 246ml NS with EMS. VSS, pt ambulatory

## 2015-12-15 NOTE — ED Notes (Signed)
Pt given gingerale for fluid challenge. 

## 2015-12-24 ENCOUNTER — Other Ambulatory Visit: Payer: Self-pay | Admitting: Family Medicine

## 2015-12-24 ENCOUNTER — Telehealth: Payer: Self-pay | Admitting: Family Medicine

## 2015-12-24 DIAGNOSIS — F901 Attention-deficit hyperactivity disorder, predominantly hyperactive type: Secondary | ICD-10-CM

## 2015-12-24 MED ORDER — AMPHETAMINE-DEXTROAMPHET ER 20 MG PO CP24: 20.0000 mg | ORAL_CAPSULE | ORAL | Status: DC

## 2015-12-24 NOTE — Telephone Encounter (Signed)
Called patient who presented today requesting her Adderall prescriptions. She states that she did not receive them at her last visit. [facility] query showed no recent fills. I explained our policy for controlled substances and told her I would give her 2 prescriptions and for further refills she would need to see her PCP Dr. Brigitte Pulse.

## 2015-12-29 ENCOUNTER — Other Ambulatory Visit: Payer: Self-pay | Admitting: Family Medicine

## 2015-12-29 MED FILL — HYDROCHLOROTHIAZIDE 25 MG T: 25 | 90 days supply | Qty: 90 | Fill #2 | Status: TO

## 2015-12-29 MED FILL — hydrOXYzine HCL 25 MG TABS: 25 | 10 days supply | Qty: 60 | Fill #3

## 2015-12-29 MED FILL — CITALOPRAM HBR 10 MG TABLET: 10 | 30 days supply | Qty: 30 | Fill #4 | Status: TO

## 2015-12-29 MED FILL — DEXTROAMP-AMPHET ER 20 MG C: 20 | 30 days supply | Qty: 30 | Fill #0

## 2015-12-29 MED FILL — clonazePAM 0.5 MG TABS: 0.5 | 10 days supply | Qty: 30 | Fill #1 | Status: TO

## 2015-12-29 MED FILL — LEVOCETIRIZINE 5 MG TABLET: 5 | 30 days supply | Qty: 30 | Fill #5

## 2016-01-27 ENCOUNTER — Emergency Department
Admission: EM | Admit: 2016-01-27 | Discharge: 2016-01-27 | Disposition: A | Discharge status: Home / Self Care | Payer: Self-pay | Source: Home / Self Care

## 2016-01-27 DIAGNOSIS — Z111 Encounter for screening for respiratory tuberculosis: Secondary | ICD-10-CM

## 2016-01-27 MED ORDER — TUBERCULIN PPD 5 UNIT/0.1ML ID SOLN
5.0000 [IU] | Freq: Once | INTRADERMAL | Status: DC
  Administered 2016-01-27: 5 [IU] via INTRADERMAL

## 2016-01-27 NOTE — ED Notes (Signed)
Tracey Reed is here today for PPD placement.

## 2016-01-29 ENCOUNTER — Emergency Department
Admission: EM | Admit: 2016-01-29 | Discharge: 2016-01-29 | Disposition: A | Discharge status: Home / Self Care | Payer: Self-pay | Source: Home / Self Care

## 2016-01-29 ENCOUNTER — Encounter: Payer: Self-pay | Admitting: Emergency Medicine

## 2016-01-29 LAB — READ PPD: TB Skin Test: NEGATIVE

## 2016-01-29 NOTE — ED Notes (Signed)
Here  for ppd read

## 2016-03-07 ENCOUNTER — Encounter: Payer: Self-pay | Admitting: Family Medicine

## 2016-03-07 DIAGNOSIS — I10 Essential (primary) hypertension: Secondary | ICD-10-CM

## 2016-03-08 MED ORDER — ATENOLOL 25 MG PO TABS: 25.0000 mg | ORAL_TABLET | Freq: Every day | ORAL | 2 refills | Status: DC

## 2016-03-08 NOTE — Telephone Encounter (Signed)
I was able to send in RFs of atenolol from remaining RFs that Dr L wrote for pt in April. Dr Brigitte Pulse, pt had a med refill check up in April, but I don't see ibuprofen discussed recently. OK to give RFs? Please see pt's email to you.

## 2016-03-09 ENCOUNTER — Telehealth: Payer: Self-pay

## 2016-03-09 NOTE — Telephone Encounter (Signed)
Spoke with pt, the pharmacist wants [place] to prescribe 50mg  so pt can cut in half. Please advise.

## 2016-03-09 NOTE — Telephone Encounter (Signed)
Pt went to [facility] to get her atenalog and it is on back order everywhere.  They have sent a request to change the dosage amount so that she can purchase it.  She also needs a refill on the ibuprofen.  [phone]  She is completely out of her medicine since last Friday.

## 2016-03-09 NOTE — Telephone Encounter (Signed)
Called pharmacy and change medication dosage. Notified patient that this was done.

## 2016-03-09 NOTE — Telephone Encounter (Signed)
Please call pharmacist and change RX per pharmacy reqs. I will cosign.  Dosage and refills need to remain the same. Philis Fendt, MS, PA-C 4:47 PM, 03/09/2016

## 2016-03-10 MED ORDER — IBUPROFEN 800 MG PO TABS: 800.0000 mg | ORAL_TABLET | Freq: Three times a day (TID) | ORAL | 5 refills | Status: DC | PRN

## 2016-05-21 ENCOUNTER — Other Ambulatory Visit: Payer: Self-pay | Admitting: Family Medicine

## 2016-05-21 DIAGNOSIS — F4323 Adjustment disorder with mixed anxiety and depressed mood: Secondary | ICD-10-CM

## 2016-05-23 NOTE — Telephone Encounter (Signed)
10/2015 last ov 11/2015 last labs 

## 2016-05-24 NOTE — Telephone Encounter (Signed)
Dr. Joseph Art sent in a 1 year supply on 10/2015  when pt was last seen so she really shouldn't need a refill.  I have not seen pt in over a year.

## 2016-06-14 ENCOUNTER — Telehealth: Payer: Self-pay

## 2016-06-14 ENCOUNTER — Other Ambulatory Visit: Payer: Self-pay | Admitting: Family Medicine

## 2016-06-14 DIAGNOSIS — F4323 Adjustment disorder with mixed anxiety and depressed mood: Secondary | ICD-10-CM

## 2016-06-14 NOTE — Telephone Encounter (Signed)
Pt states that she has no insurance right now and is hoping that we could give her a month worth on both the clonazepam and citalopram until she can come in to be seen   Best number [phone]

## 2016-06-15 NOTE — Telephone Encounter (Signed)
As I noted in the piror refill request, Dr. Joseph Art prescribed pt a year worth of citalopram in April 2017 she she should not be out. Ok to call in current clonazepam rx but #20 tabs only, no further refills w/o OV.

## 2016-06-15 NOTE — Telephone Encounter (Signed)
Dr. Brigitte Pulse Last office visit was with Dr. Joseph Art 11/06/15  Please advise

## 2016-06-16 MED ORDER — CLONAZEPAM 0.5 MG PO TABS: 0.5000 mg | ORAL_TABLET | Freq: Two times a day (BID) | ORAL | 0 refills | Status: DC | PRN

## 2016-06-16 MED ORDER — CITALOPRAM HYDROBROMIDE 10 MG PO TABS: 10.0000 mg | ORAL_TABLET | Freq: Every day | ORAL | 4 refills | Status: DC

## 2016-06-16 NOTE — Telephone Encounter (Signed)
Spoke to pt who reported that she no longer works for [employer] so she can't get her RFs at [facility]. I advised I will re-send her remaining RFs of citalopram to [facility] as req'd. Also called in the #20 clonazepam and advised pt she will have to come in before we can get her more of this. Pt agreed and will use sparingly.

## 2016-06-24 ENCOUNTER — Encounter (HOSPITAL_COMMUNITY): Payer: Self-pay

## 2016-06-24 ENCOUNTER — Ambulatory Visit (HOSPITAL_COMMUNITY)
Admission: EM | Admit: 2016-06-24 | Discharge: 2016-06-24 | Disposition: A | Discharge status: Home / Self Care | Payer: Self-pay | Attending: Emergency Medicine | Admitting: Emergency Medicine

## 2016-06-24 DIAGNOSIS — M545 Low back pain, unspecified: Secondary | ICD-10-CM

## 2016-06-24 DIAGNOSIS — S39012A Strain of muscle, fascia and tendon of lower back, initial encounter: Secondary | ICD-10-CM

## 2016-06-24 MED ORDER — NAPROXEN 375 MG PO TABS: 375.0000 mg | ORAL_TABLET | Freq: Two times a day (BID) | ORAL | 0 refills | Status: DC

## 2016-06-24 NOTE — Discharge Instructions (Signed)
Apply heat to the lower back 2-3 times a day. Perform stretches as demonstrated. No heavy lifting, bending or twisting for the next several days until your back is feeling much better. I take the medication as directed. Take with food.
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
## 2016-07-20 ENCOUNTER — Other Ambulatory Visit: Payer: Self-pay | Admitting: Family Medicine

## 2016-07-20 DIAGNOSIS — I10 Essential (primary) hypertension: Secondary | ICD-10-CM

## 2016-07-20 DIAGNOSIS — R600 Localized edema: Secondary | ICD-10-CM

## 2016-08-07 NOTE — Progress Notes (Signed)
Subjective:    Patient ID: Tracey Reed, female    DOB: [birth year], 46 y.o.   MRN: [id number] Chief Complaint  Patient presents with  . Medication Refill    all meds  . Fatigue    comes and goes    HPI   Tracey Reed is a 46 yo woman here for follow-up on her chronic medical conditions. Her last visit here was 9 mos prior with a colleague. I have not seen her in 15 mos.  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  She does not know what her schedule is going to look like.  She was unable to continue classes due to finances. She started working in October and has had to miss work 3-4x/mo due to mentally just not feeling up to being able to go - her job was really stressful as it expected her to do we more than she was able and she felt that her supervisor did not like her. In addition she was going through a divorce.  THe kids On the school bus can be very disrepectful which really upsets her.  Pt's mother has TIAs.   She is living with brother and her cousin now hoping to get enough money together to move in with her daughter.  She was seeing a specialists for migraines triggered by hot Weather  Mood d/o: Pt has been struggling with anxiety, and depression. Has generalized anxiety as well as panic attacks. She also suspects she has adult ADD after her daughter was diagnosed.  She tried citalopram but it worsened her anxiety so we had decreased this from 20 to 36m and started wellbutrin which only helped a little but seemed to cause a HA. So at her last visit with me she weaned off of wellbutrin and started trial of adderall XR 264mqd whicle continuing celexa 1066mShe had been using klonopin qam but was exacerbating fatigue and difficulty concentrating so tried to change klonopin to qhs with prn hydroxyzine during the day which did not help.  Pt has been unable to see psycho or therapy due to busy schedule as a single mother. Her daughter is 21 29. She  was taking klonopin during the day as needed for panic attacks. She had recently tried to switch it over to nighttime use instead on my prior recommendations.  She decreased 3x/wk she gets nerves/anxiety, can't focus, overwhelmed, scatterbrained. Does use hydroxyzine as needed for stress induced urticaria.  The adderall definietly helps her anxiety - helps her drink less water. Occasional trouble sleeping for which she would take flexeril.  HTN: was on atenolol but concerned it was exacerbating fatigue but off had increased HAs and light-headedness so restarted. Also on hctz.  Pt had just took her med prior to the visit. Does not have a home cuff.  She feels like she is having more edema.  H/o low TSH: saw Dr. GheCruzita Ledererow TSH was secondary to Biotin use. She does have a benign multinodular goiter  GERD: Moderate seen on BA swallow but was advised by Dr. GerLetta Median try zantac rather than omeprazole but if gerd sxs breakthrough or it doesn't work, could try ditropan or levsin to treat her sweats. Fortunately the sweats have stopped now that she is off of omeprazole.  DM: A1c 6.7 15 mos prior.  She has just restarted working on tlc.  She is started walking this week with her daughter - 3x/wk and gym  3x/wk - [employer]. Meal prep with more grilled foods, avoiding fatty foods, more whole grains, white meat, veggies, water.  She is not checking her blood sugar.  IBS: Amitiza helped a lot, prn imodium.  Ongoing constipation. Has been of the amitza for about 6 months and would like to resume.  Vitamin D def resolved, can use otc but has stopped due to supression of thyroid.  My understanding was that the biotin had decreased her TSH. I do not think vitamin D would do this.  Seasonal allergies: singular, xyzal - she has been started sneezing more off of it. Occ rhinitis and congestion and worse around cats.   Past Medical History:  Diagnosis Date  . Anxiety   . Hypertension    Past Surgical History:    Procedure Laterality Date  . ENDOMETRIAL ABLATION    . TUBAL LIGATION     Current Outpatient Prescriptions on File Prior to Visit  Medication Sig Dispense Refill  . blood glucose meter kit and supplies KIT Dispense based on patient and insurance preference. Use up to four times daily as directed. (FOR ICD-9 250.00, 250.01). 1 each 0  . cyclobenzaprine (FLEXERIL) 10 MG tablet Take 1 tablet (10 mg total) by mouth 3 (three) times daily as needed. 60 tablet 2  . glucose blood test strip Use as instructed UP TO 4 TIMES A DAY 100 each 3  . hydrOXYzine (ATARAX/VISTARIL) 25 MG tablet Take 1 tablet (25 mg total) by mouth every 4 (four) hours as needed for anxiety or itching. For itching and hives 60 tablet 5  . levocetirizine (XYZAL) 5 MG tablet Take 1 tablet (5 mg total) by mouth every evening. 30 tablet 11  . omeprazole (PRILOSEC) 20 MG capsule Take 1 capsule (20 mg total) by mouth daily. 30 capsule 11  . TRUEPLUS LANCETS 30G MISC USE 4 TIMES DAILY 100 each 3   No current facility-administered medications on file prior to visit.    Allergies  Allergen Reactions  . Clindamycin/Lincomycin Rash   Family History  Problem Relation Age of Onset  . Hypertension Mother   . Cancer Maternal Grandmother   . Thyroid disease Neg Hx    Social History   Social History  . Marital status: Legally Separated    Spouse name: N/A  . Number of children: N/A  . Years of education: N/A   Social History Main Topics  . Smoking status: Never Smoker  . Smokeless tobacco: Never Used  . Alcohol use Yes     Comment: occasional  . Drug use: No  . Sexual activity: Not Asked   Other Topics Concern  . None   Social History Narrative  . None   Depression screen [facility] 2/9 08/08/2016 11/26/2015 11/26/2015 07/30/2015 05/12/2015  Decreased Interest 0 0 0 0 0  Down, Depressed, Hopeless 0 0 0 0 0  PHQ - 2 Score 0 0 0 0 0  Altered sleeping - - - - -  Tired, decreased energy - - - - -  Change in appetite - - - - -   Feeling bad or failure about yourself  - - - - -  Trouble concentrating - - - - -  Moving slowly or fidgety/restless - - - - -  Suicidal thoughts - - - - -  PHQ-9 Score - - - - -  Difficult doing work/chores - - - - -    Review of Systems  Constitutional: Positive for activity change and fatigue. Negative for appetite change,  chills, fever and unexpected weight change.  HENT: Positive for congestion, postnasal drip, rhinorrhea and sneezing.   Respiratory: Negative for chest tightness and shortness of breath.   Cardiovascular: Negative for chest pain and palpitations.  Gastrointestinal: Positive for abdominal pain and constipation. Negative for vomiting.  Endocrine: Positive for heat intolerance. Negative for polydipsia.  Skin: Positive for rash. Negative for color change.  Allergic/Immunologic: Positive for environmental allergies.  Neurological: Positive for headaches. Negative for light-headedness.  Psychiatric/Behavioral: Positive for agitation, decreased concentration, dysphoric mood and sleep disturbance. Negative for behavioral problems, confusion, hallucinations and self-injury. The patient is nervous/anxious. The patient is not hyperactive.        Objective:   Physical Exam  Constitutional: She is oriented to person, place, and time. She appears well-developed and well-nourished. No distress.  HENT:  Head: Normocephalic and atraumatic.  Right Ear: External ear normal.  Left Ear: External ear normal.  Eyes: Conjunctivae are normal. No scleral icterus.  Neck: Normal range of motion. Neck supple. No thyromegaly present.  Cardiovascular: Normal rate, regular rhythm, normal heart sounds and intact distal pulses.   Pulmonary/Chest: Effort normal and breath sounds normal. No respiratory distress.  Musculoskeletal: She exhibits no edema.  Lymphadenopathy:    She has no cervical adenopathy.  Neurological: She is alert and oriented to person, place, and time.  Skin: Skin is warm  and dry. She is not diaphoretic. No erythema.  Psychiatric: She has a normal mood and affect. Her behavior is normal.      BP (!) 148/80   Pulse (!) 57   Temp 98.1 F (36.7 C) (Oral)   Resp 16   Ht 5' 5"  (1.651 m)   Wt 290 lb (131.5 kg)   SpO2 96%   BMI 48.26 kg/m   Assessment & Plan:  Cmp, POCT a1c Is on stimulant and bzd.  1. Type 2 diabetes mellitus without complication, without long-term current use of insulin (HCC) - A1c highest at 6.9 today. start metformin 500 daily a.m. work on diet and exercise   2. Essential hypertension - patient doesn't feel that HCTZ is a strong enough diuretic so changed to chlorthalidone. Continue atenolol.   3. Irritable bowel syndrome with diarrhea - resume Amitiza  4. Anxiety state - I'm not sure that this has ever been adequately treated by citalopram 20. Continue citalopram 10 for now but will likely want to stop and consider trial of sertraline instead if symptoms do not improve with other med changes.   5. Medication monitoring encounter   6. Urticaria, chronic - due to stress - prn hydroxyzine  7. Need for prophylactic vaccination and inoculation against influenza   8. Fatigue, unspecified type   9. Attention-deficit hyperactivity disorder, predominantly hyperactive type - many of patient's anxiety complaints seen more to be due to the untreated the DDD so advised that I will hope her Klonopin need will decrease as she resumes ADD treatment and we will be able to taper her off of the benzodiazepines.  10. Adjustment disorder with mixed anxiety and depressed mood   11. Irritable bowel syndrome with both constipation and diarrhea   12. Environmental allergies - cont xyzal and singulair with prn hydroxyzine  13.    HAs - Improved in the winter. Refilled ibuprofen 800.  Orders Placed This Encounter  Procedures  . Flu Vaccine QUAD 36+ mos IM  . Comprehensive metabolic panel  . Hemoglobin A1c  . CBC  . Vitamin B12  . VITAMIN D 25 Hydroxy  (Vit-D Deficiency,  Fractures)  . Thyroid Panel With TSH    Meds ordered this encounter  Medications  . chlorthalidone (HYGROTON) 25 MG tablet    Sig: Take 1 tablet (25 mg total) by mouth daily.    Dispense:  90 tablet    Refill:  1  . atenolol (TENORMIN) 25 MG tablet    Sig: Take 1 tablet (25 mg total) by mouth daily.    Dispense:  90 tablet    Refill:  2  . amphetamine-dextroamphetamine (ADDERALL XR) 20 MG 24 hr capsule    Sig: Take 1 capsule (20 mg total) by mouth every morning.    Dispense:  30 capsule    Refill:  0  . amphetamine-dextroamphetamine (ADDERALL XR) 20 MG 24 hr capsule    Sig: Take 1 capsule (20 mg total) by mouth every morning.    Dispense:  30 capsule    Refill:  0    May fill 30d after date written.  Marland Kitchen amphetamine-dextroamphetamine (ADDERALL XR) 20 MG 24 hr capsule    Sig: Take 1 capsule (20 mg total) by mouth every morning.    Dispense:  30 capsule    Refill:  0    May fill 60 days after date written.  . clonazePAM (KLONOPIN) 0.5 MG tablet    Sig: Take 1 tablet (0.5 mg total) by mouth 2 (two) times daily as needed (panic attacks).    Dispense:  20 tablet    Refill:  1  . citalopram (CELEXA) 10 MG tablet    Sig: Take 1 tablet (10 mg total) by mouth daily.    Dispense:  30 tablet    Refill:  3  . lubiprostone (AMITIZA) 24 MCG capsule    Sig: Take 1 capsule (24 mcg total) by mouth 2 (two) times daily with a meal.    Dispense:  60 capsule    Refill:  5  . montelukast (SINGULAIR) 10 MG tablet    Sig: Take 1 tablet (10 mg total) by mouth at bedtime.    Dispense:  90 tablet    Refill:  3  . ibuprofen (ADVIL,MOTRIN) 800 MG tablet    Sig: Take 1 tablet (800 mg total) by mouth every 8 (eight) hours as needed.    Dispense:  60 tablet    Refill:  5  . metFORMIN (GLUCOPHAGE) 500 MG tablet    Sig: Take 1 tablet (500 mg total) by mouth daily with breakfast.    Dispense:  90 tablet    Refill:  1   Over 40 min spent in face-to-face evaluation of and  consultation with patient and coordination of care.  Over 50% of this time was spent counseling this patient.   [name], M.D.  [facility] [address] [place], [place] [address] [phone] phone [phone] fax  08/10/16 11:03 AM

## 2016-08-08 ENCOUNTER — Ambulatory Visit (INDEPENDENT_AMBULATORY_CARE_PROVIDER_SITE_OTHER): Payer: BLUE CROSS/BLUE SHIELD | Admitting: Family Medicine

## 2016-08-08 ENCOUNTER — Encounter: Payer: Self-pay | Admitting: Family Medicine

## 2016-08-08 VITALS — BP 148/80 | HR 57 | Temp 98.1°F | Resp 16 | Ht 65.0 in | Wt 290.0 lb

## 2016-08-08 DIAGNOSIS — K580 Irritable bowel syndrome with diarrhea: Secondary | ICD-10-CM | POA: Diagnosis not present

## 2016-08-08 DIAGNOSIS — F4323 Adjustment disorder with mixed anxiety and depressed mood: Secondary | ICD-10-CM

## 2016-08-08 DIAGNOSIS — Z23 Encounter for immunization: Secondary | ICD-10-CM | POA: Diagnosis not present

## 2016-08-08 DIAGNOSIS — K582 Mixed irritable bowel syndrome: Secondary | ICD-10-CM

## 2016-08-08 DIAGNOSIS — F901 Attention-deficit hyperactivity disorder, predominantly hyperactive type: Secondary | ICD-10-CM

## 2016-08-08 DIAGNOSIS — R5383 Other fatigue: Secondary | ICD-10-CM

## 2016-08-08 DIAGNOSIS — F411 Generalized anxiety disorder: Secondary | ICD-10-CM

## 2016-08-08 DIAGNOSIS — Z9109 Other allergy status, other than to drugs and biological substances: Secondary | ICD-10-CM

## 2016-08-08 DIAGNOSIS — Z5181 Encounter for therapeutic drug level monitoring: Secondary | ICD-10-CM

## 2016-08-08 DIAGNOSIS — I10 Essential (primary) hypertension: Secondary | ICD-10-CM

## 2016-08-08 DIAGNOSIS — E119 Type 2 diabetes mellitus without complications: Secondary | ICD-10-CM

## 2016-08-08 DIAGNOSIS — L508 Other urticaria: Secondary | ICD-10-CM

## 2016-08-08 MED ORDER — IBUPROFEN 800 MG PO TABS: 800.0000 mg | ORAL_TABLET | Freq: Three times a day (TID) | ORAL | 5 refills | Status: DC | PRN

## 2016-08-08 MED ORDER — LUBIPROSTONE 24 MCG PO CAPS: 24.0000 ug | ORAL_CAPSULE | Freq: Two times a day (BID) | ORAL | 5 refills | Status: DC

## 2016-08-08 MED ORDER — AMPHETAMINE-DEXTROAMPHET ER 20 MG PO CP24: 20.0000 mg | ORAL_CAPSULE | ORAL | 0 refills | Status: DC

## 2016-08-08 MED ORDER — CITALOPRAM HYDROBROMIDE 10 MG PO TABS: 10.0000 mg | ORAL_TABLET | Freq: Every day | ORAL | 3 refills | Status: DC

## 2016-08-08 MED ORDER — ATENOLOL 25 MG PO TABS: 25.0000 mg | ORAL_TABLET | Freq: Every day | ORAL | 2 refills | Status: DC

## 2016-08-08 MED ORDER — CLONAZEPAM 0.5 MG PO TABS: 0.5000 mg | ORAL_TABLET | Freq: Two times a day (BID) | ORAL | 1 refills | Status: DC | PRN

## 2016-08-08 MED ORDER — CHLORTHALIDONE 25 MG PO TABS: 25.0000 mg | ORAL_TABLET | Freq: Every day | ORAL | 1 refills | Status: DC

## 2016-08-08 MED ORDER — MONTELUKAST SODIUM 10 MG PO TABS: 10.0000 mg | ORAL_TABLET | Freq: Every day | ORAL | 3 refills | Status: DC

## 2016-08-08 NOTE — Patient Instructions (Addendum)
Remember that because Adderall and Klonopin her controlled medications you will need to be seen in an office visit for any refills of these. You cannot call in for these refills or have pharmacy send a request. As you become restarted on your Adderall to help with the fatigue, scatterbrained, and feeling overwhelmed IM hoping you will need less Klonopin. If that is not the case then we need to reevaluate and see what other treatment strategies might work as I do not think that being on a stimulant and a sedative simultaneously for long-term is a safe idea.  I am not sure that citalopram has ever been the ideal antidepressant and anti-anxiety medication for you. You noted side effects on the higher dose so when you are stable on your current medication regimen that we are restarting today and might be worth stopping the citalopram and seen if you feel any different off of it. I imagine that you would likely benefit from being on another antidepressant and Zoloft or sertraline would be my next recommendation for you. If you would like to stop the citalopram and start on Zoloft you may call and let me know or we can discuss this at your next visit.     IF you received an x-ray today, you will receive an invoice from [facility]. Please contact [facility] at [phone] with questions or concerns regarding your invoice.   IF you received labwork today, you will receive an invoice from [facility]. Please contact [facility] at [phone] with questions or concerns regarding your invoice.   Our billing staff will not be able to assist you with questions regarding bills from these companies.  You will be contacted with the lab results as soon as they are available. The fastest way to get your results is to activate your My Chart account. Instructions are located on the last page of this paperwork. If you have not heard from [place] regarding the results in 2 weeks, please contact this office.       Fatigue Introduction Fatigue is feeling tired all of the time, a lack of energy, or a lack of motivation. Occasional or mild fatigue is often a normal response to activity or life in general. However, long-lasting (chronic) or extreme fatigue may indicate an underlying medical condition. Follow these instructions at home: Watch your fatigue for any changes. The following actions may help to lessen any discomfort you are feeling:  Talk to your health care provider about how much sleep you need each night. Try to get the required amount every night.  Take medicines only as directed by your health care provider.  Eat a healthy and nutritious diet. Ask your health care provider if you need help changing your diet.  Drink enough fluid to keep your urine clear or pale yellow.  Practice ways of relaxing, such as yoga, meditation, massage therapy, or acupuncture.  Exercise regularly.  Change situations that cause you stress. Try to keep your work and personal routine reasonable.  Do not abuse illegal drugs.  Limit alcohol intake to no more than 1 drink per day for nonpregnant women and 2 drinks per day for men. One drink equals 12 ounces of beer, 5 ounces of wine, or 1 ounces of hard liquor.  Take a multivitamin, if directed by your health care provider. Contact a health care provider if:  Your fatigue does not get better.  You have a fever.  You have unintentional weight loss or gain.  You have headaches.  You  have difficulty:  Falling asleep.  Sleeping throughout the night.  You feel angry, guilty, anxious, or sad.  You are unable to have a bowel movement (constipation).  You skin is dry.  Your legs or another part of your body is swollen. Get help right away if:  You feel confused.  Your vision is blurry.  You feel faint or pass out.  You have a severe headache.  You have severe abdominal, pelvic, or back pain.  You have chest pain, shortness of breath,  or an irregular or fast heartbeat.  You are unable to urinate or you urinate less than normal.  You develop abnormal bleeding, such as bleeding from the rectum, vagina, nose, lungs, or nipples.  You vomit blood.  You have thoughts about harming yourself or committing suicide.  You are worried that you might harm someone else. This information is not intended to replace advice given to you by your health care provider. Make sure you discuss any questions you have with your health care provider. Document Released: 05/15/2007 Document Revised: 12/24/2015 Document Reviewed: 11/19/2013  2017 Elsevier  Stress and Stress Management Stress is a normal reaction to life events. It is what you feel when life demands more than you are used to or more than you can handle. Some stress can be useful. For example, the stress reaction can help you catch the last bus of the day, study for a test, or meet a deadline at work. But stress that occurs too often or for too long can cause problems. It can affect your emotional health and interfere with relationships and normal daily activities. Too much stress can weaken your immune system and increase your risk for physical illness. If you already have a medical problem, stress can make it worse. What are the causes? All sorts of life events may cause stress. An event that causes stress for one person may not be stressful for another person. Major life events commonly cause stress. These may be positive or negative. Examples include losing your job, moving into a new home, getting married, having a baby, or losing a loved one. Less obvious life events may also cause stress, especially if they occur day after day or in combination. Examples include working long hours, driving in traffic, caring for children, being in debt, or being in a difficult relationship. What are the signs or symptoms? Stress may cause emotional symptoms including, the following:  Anxiety. This  is feeling worried, afraid, on edge, overwhelmed, or out of control.  Anger. This is feeling irritated or impatient.  Depression. This is feeling sad, down, helpless, or guilty.  Difficulty focusing, remembering, or making decisions. Stress may cause physical symptoms, including the following:  Aches and pains. These may affect your head, neck, back, stomach, or other areas of your body.  Tight muscles or clenched jaw.  Low energy or trouble sleeping. Stress may cause unhealthy behaviors, including the following:  Eating to feel better (overeating) or skipping meals.  Sleeping too little, too much, or both.  Working too much or putting off tasks (procrastination).  Smoking, drinking alcohol, or using drugs to feel better. How is this diagnosed? Stress is diagnosed through an assessment by your health care provider. Your health care provider will ask questions about your symptoms and any stressful life events.Your health care provider will also ask about your medical history and may order blood tests or other tests. Certain medical conditions and medicine can cause physical symptoms similar to stress. Mental illness  can cause emotional symptoms and unhealthy behaviors similar to stress. Your health care provider may refer you to a mental health professional for further evaluation. How is this treated? Stress management is the recommended treatment for stress.The goals of stress management are reducing stressful life events and coping with stress in healthy ways. Techniques for reducing stressful life events include the following:  Stress identification. Self-monitor for stress and identify what causes stress for you. These skills may help you to avoid some stressful events.  Time management. Set your priorities, keep a calendar of events, and learn to say "no." These tools can help you avoid making too many commitments. Techniques for coping with stress include the  following:  Rethinking the problem. Try to think realistically about stressful events rather than ignoring them or overreacting. Try to find the positives in a stressful situation rather than focusing on the negatives.  Exercise. Physical exercise can release both physical and emotional tension. The key is to find a form of exercise you enjoy and do it regularly.  Relaxation techniques. These relax the body and mind. Examples include yoga, meditation, tai chi, biofeedback, deep breathing, progressive muscle relaxation, listening to music, being out in nature, journaling, and other hobbies. Again, the key is to find one or more that you enjoy and can do regularly.  Healthy lifestyle. Eat a balanced diet, get plenty of sleep, and do not smoke. Avoid using alcohol or drugs to relax.  Strong support network. Spend time with family, friends, or other people you enjoy being around.Express your feelings and talk things over with someone you trust. Counseling or talktherapy with a mental health professional may be helpful if you are having difficulty managing stress on your own. Medicine is typically not recommended for the treatment of stress.Talk to your health care provider if you think you need medicine for symptoms of stress. Follow these instructions at home:  Keep all follow-up visits as directed by your health care provider.  Take all medicines as directed by your health care provider. Contact a health care provider if:  Your symptoms get worse or you start having new symptoms.  You feel overwhelmed by your problems and can no longer manage them on your own. Get help right away if:  You feel like hurting yourself or someone else. This information is not intended to replace advice given to you by your health care provider. Make sure you discuss any questions you have with your health care provider. Document Released: 01/11/2001 Document Revised: 12/24/2015 Document Reviewed:  03/12/2013 Elsevier Interactive Patient Education  2017 [employer].

## 2016-08-09 LAB — CBC
Hematocrit: 38.4 % (ref 34.0–46.6)
Hemoglobin: 12.5 g/dL (ref 11.1–15.9)
MCH: 26.6 pg (ref 26.6–33.0)
MCHC: 32.6 g/dL (ref 31.5–35.7)
MCV: 82 fL (ref 79–97)
Platelets: 437 10*3/uL — ABNORMAL HIGH (ref 150–379)
RBC: 4.7 x10E6/uL (ref 3.77–5.28)
RDW: 15.8 % — ABNORMAL HIGH (ref 12.3–15.4)
WBC: 7.4 10*3/uL (ref 3.4–10.8)

## 2016-08-09 LAB — THYROID PANEL WITH TSH
Free Thyroxine Index: 1.9 (ref 1.2–4.9)
T3 Uptake Ratio: 26 % (ref 24–39)
T4, Total: 7.4 ug/dL (ref 4.5–12.0)
TSH: 0.574 u[IU]/mL (ref 0.450–4.500)

## 2016-08-09 LAB — VITAMIN B12: Vitamin B-12: 547 pg/mL (ref 232–1245)

## 2016-08-09 LAB — COMPREHENSIVE METABOLIC PANEL
ALT: 15 IU/L (ref 0–32)
AST: 13 IU/L (ref 0–40)
Albumin/Globulin Ratio: 1.1 — ABNORMAL LOW (ref 1.2–2.2)
Albumin: 3.8 g/dL (ref 3.5–5.5)
Alkaline Phosphatase: 67 IU/L (ref 39–117)
BUN/Creatinine Ratio: 14 (ref 9–23)
BUN: 11 mg/dL (ref 6–24)
Bilirubin Total: <0.2 mg/dL (ref 0.0–1.2)
CO2: 26 mmol/L (ref 18–29)
Calcium: 9.3 mg/dL (ref 8.7–10.2)
Chloride: 100 mmol/L (ref 96–106)
Creatinine, Ser: 0.78 mg/dL (ref 0.57–1.00)
GFR calc Af Amer: 105 mL/min/{1.73_m2} (ref >59)
GFR calc non Af Amer: 91 mL/min/{1.73_m2} (ref >59)
Globulin, Total: 3.4 g/dL (ref 1.5–4.5)
Glucose: 101 mg/dL — ABNORMAL HIGH (ref 65–99)
Potassium: 4.4 mmol/L (ref 3.5–5.2)
Sodium: 141 mmol/L (ref 134–144)
Total Protein: 7.2 g/dL (ref 6.0–8.5)

## 2016-08-09 LAB — HEMOGLOBIN A1C
Est. average glucose Bld gHb Est-mCnc: 151 mg/dL
Hgb A1c MFr Bld: 6.9 % — ABNORMAL HIGH (ref 4.8–5.6)

## 2016-08-09 LAB — VITAMIN D 25 HYDROXY (VIT D DEFICIENCY, FRACTURES): Vit D, 25-Hydroxy: 20.6 ng/mL — ABNORMAL LOW (ref 30.0–100.0)

## 2016-08-10 MED ORDER — METFORMIN HCL 500 MG PO TABS: 500.0000 mg | ORAL_TABLET | Freq: Every day | ORAL | 1 refills | Status: DC

## 2016-08-26 ENCOUNTER — Encounter: Payer: Self-pay | Admitting: Family Medicine

## 2016-09-14 ENCOUNTER — Other Ambulatory Visit: Payer: Self-pay

## 2016-09-14 NOTE — Telephone Encounter (Signed)
PATIENT CALLING TO FOLLOW-UP ON HER MY-CHART MESSAGE SHE LEFT DR. SHAW OVER A WEEK AGO. SHE SAID THERE IS AN OPTION TO REQUEST REFILLS. SHE WOULD LIKE TO BE CALLED WHEN IT HAS BEEN DONE. BEST PHONE [phone] (CELL) [place] [address]  [place]

## 2016-09-14 NOTE — Telephone Encounter (Signed)
08/2016 last ov 04/2015 last refill?

## 2016-09-15 MED ORDER — CYCLOBENZAPRINE HCL 10 MG PO TABS: 10.0000 mg | ORAL_TABLET | Freq: Three times a day (TID) | ORAL | 0 refills | Status: DC | PRN

## 2016-09-16 ENCOUNTER — Other Ambulatory Visit: Payer: Self-pay

## 2016-09-16 DIAGNOSIS — F4323 Adjustment disorder with mixed anxiety and depressed mood: Secondary | ICD-10-CM

## 2016-09-16 MED ORDER — CITALOPRAM HYDROBROMIDE 10 MG PO TABS: 10.0000 mg | ORAL_TABLET | Freq: Every day | ORAL | 3 refills | Status: DC

## 2016-09-16 NOTE — Telephone Encounter (Signed)
Fax req [facility] Citalopram HBR 10mg 

## 2016-11-07 ENCOUNTER — Ambulatory Visit: Payer: Self-pay | Admitting: Family Medicine

## 2016-11-22 ENCOUNTER — Other Ambulatory Visit: Payer: Self-pay | Admitting: Family Medicine

## 2016-11-22 DIAGNOSIS — F4323 Adjustment disorder with mixed anxiety and depressed mood: Secondary | ICD-10-CM

## 2016-11-25 ENCOUNTER — Ambulatory Visit: Payer: Self-pay | Admitting: Internal Medicine

## 2016-11-29 ENCOUNTER — Other Ambulatory Visit: Payer: Self-pay | Admitting: Family Medicine

## 2016-11-29 DIAGNOSIS — Z9109 Other allergy status, other than to drugs and biological substances: Secondary | ICD-10-CM

## 2016-12-09 ENCOUNTER — Other Ambulatory Visit: Payer: Self-pay | Admitting: Family Medicine

## 2016-12-09 NOTE — Telephone Encounter (Signed)
Chlorthalidone was a new med started at her last visit 4 months prior. Needs office visit for recheck BP and BMP prior to additional refills. She does not has a visit scheduled.

## 2017-01-10 ENCOUNTER — Other Ambulatory Visit: Payer: Self-pay | Admitting: Family Medicine

## 2017-01-10 DIAGNOSIS — F4323 Adjustment disorder with mixed anxiety and depressed mood: Secondary | ICD-10-CM

## 2017-01-11 NOTE — Telephone Encounter (Signed)
Needs OV for any refills on controlled substance

## 2017-01-11 NOTE — Telephone Encounter (Signed)
Please advise 

## 2017-01-12 NOTE — Telephone Encounter (Signed)
Mychart message sent to patient about making an OV

## 2017-02-16 ENCOUNTER — Other Ambulatory Visit: Payer: Self-pay | Admitting: Family Medicine

## 2017-02-16 NOTE — Telephone Encounter (Signed)
Pt is long-overdue for a f/u OV with FASTING labs. Please have her sched an appt within the next 1-2 months so we can continue to make sure the medicines are keeping her healthy and safe before we refill them further. Thanks.

## 2017-02-17 ENCOUNTER — Ambulatory Visit: Payer: Self-pay | Admitting: Internal Medicine

## 2017-02-20 NOTE — Telephone Encounter (Signed)
mychart message sent to pt about making an apt °

## 2017-05-13 IMAGING — RF DG ESOPHAGUS
13 of 16 series · 19 of 24 positions shown · non-contrast
Comparison: None.

CLINICAL DATA: Dysphagia, history of thyroid nodule

EXAM:
ESOPHOGRAM / BARIUM SWALLOW / BARIUM TABLET STUDY
TECHNIQUE: Combined double contrast and single contrast examination performed
using effervescent crystals, thick barium liquid, and thin barium
liquid. The patient was observed with fluoroscopy swallowing a 13 mm
barium sulphate tablet.
FLUOROSCOPY TIME:  Radiation Exposure Index (as provided by the
fluoroscopic device): 85 Gy per sq cm
If the device does not provide the exposure index:
Fluoroscopy Time:  1 minutes 54 seconds
Number of Acquired Images:

[Series 2: run · 1 of 1 slices shown (1 of 13)]
[im 1/1]
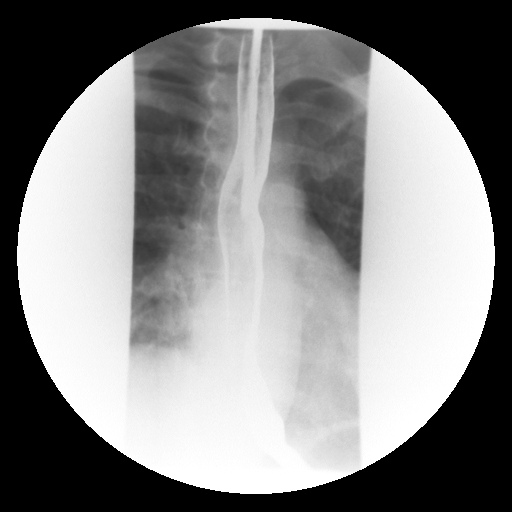

[Series 4: run · 1 of 1 slices shown (2 of 13)]
[im 1/1]
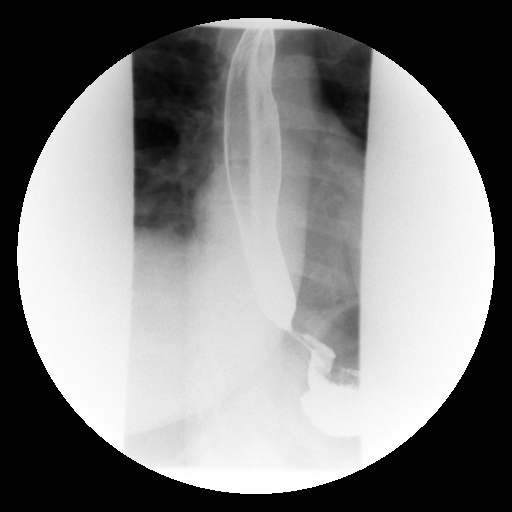

[Series 6: run · 1 of 1 slices shown (3 of 13)]
[im 1/1]
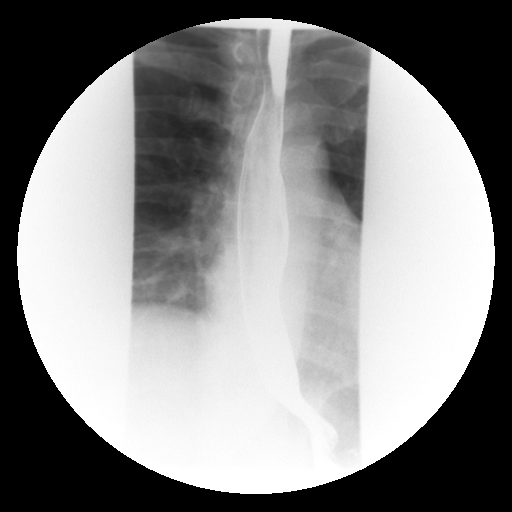

[Series 7: run · 4 of 9 slices shown (4 of 13)]
[im 1/9]
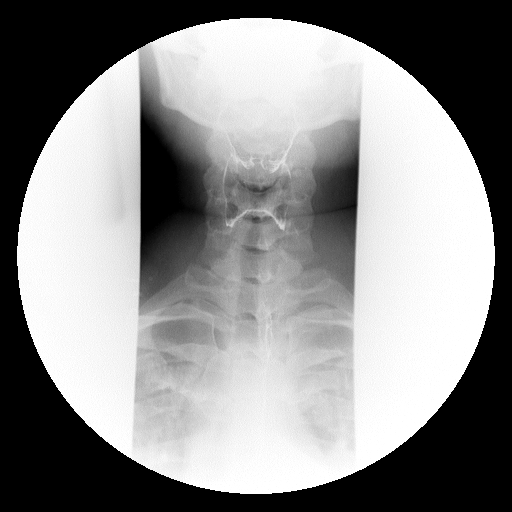
[im 3/9]
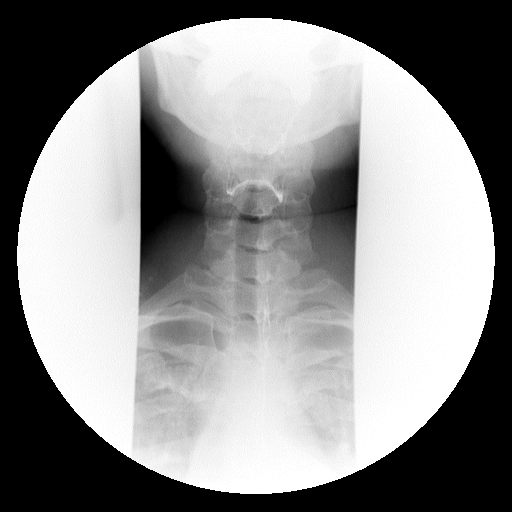
[im 5/9]
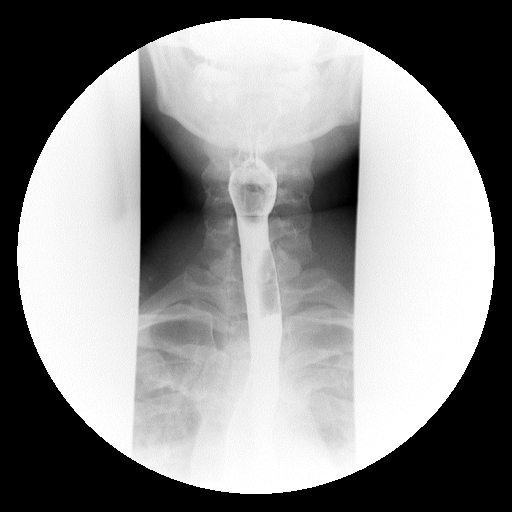
[im 9/9]
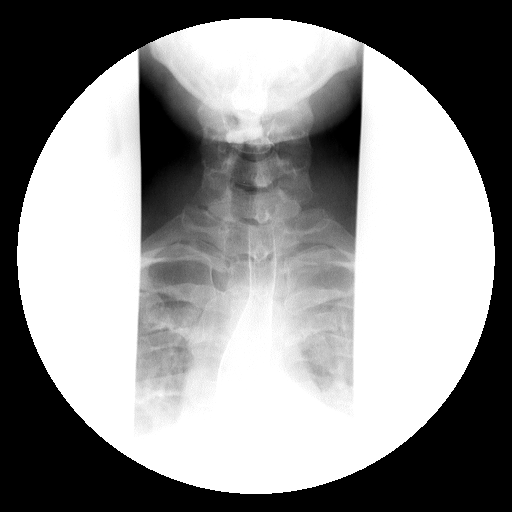

[Series 8: run · 4 of 9 slices shown (5 of 13)]
[im 1/9]
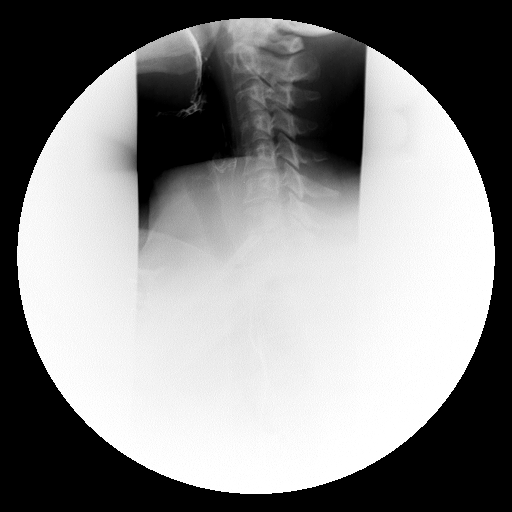
[im 3/9]
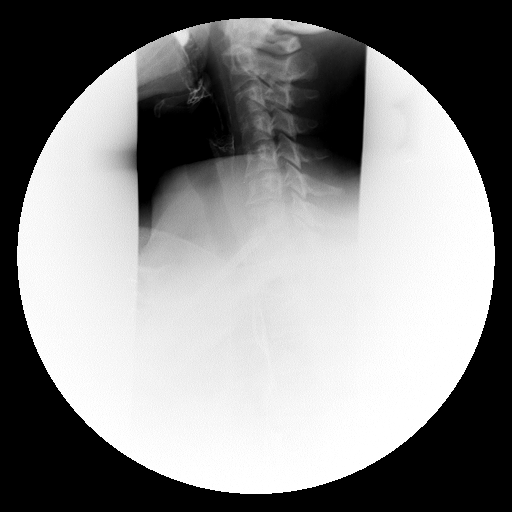
[im 7/9]
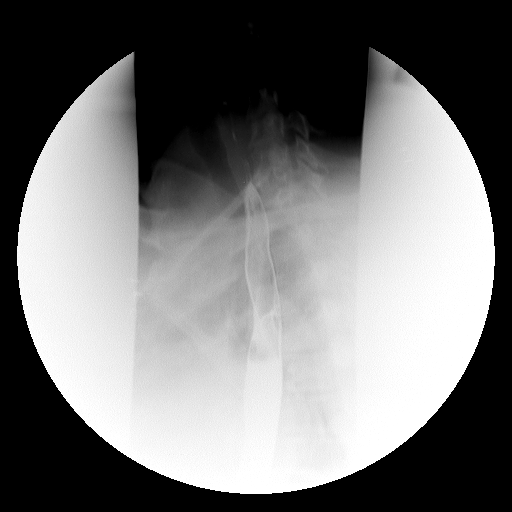
[im 9/9]
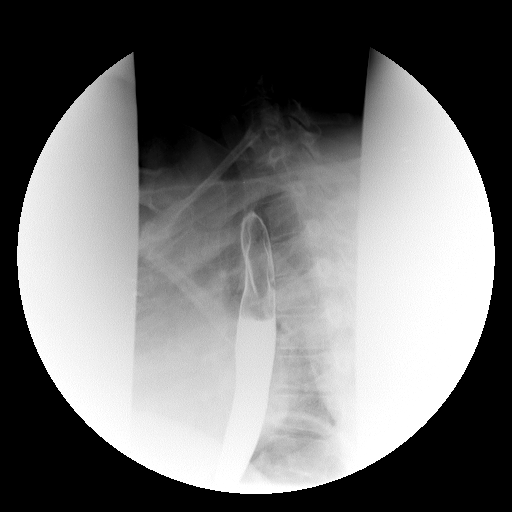

[Series 9: run · 1 of 1 slices shown (6 of 13)]
[im 1/1]
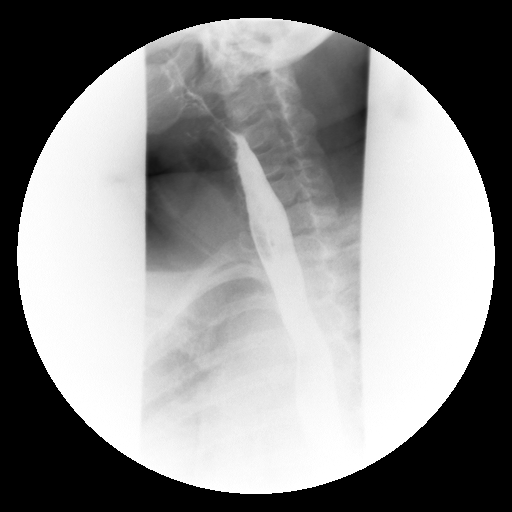

[Series 10: run · 1 of 1 slices shown (7 of 13)]
[im 1/1]
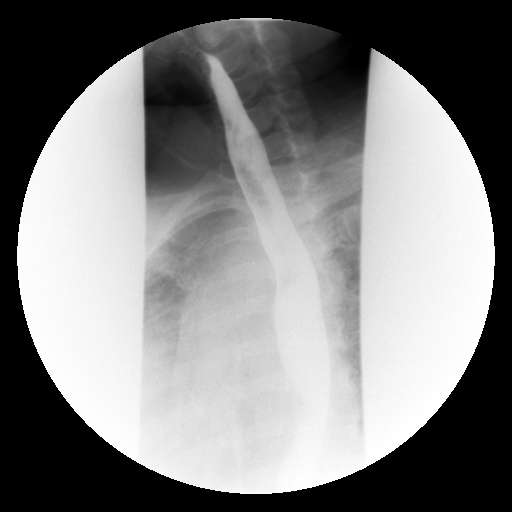

[Series 12: run · 1 of 1 slices shown (8 of 13)]
[im 1/1]
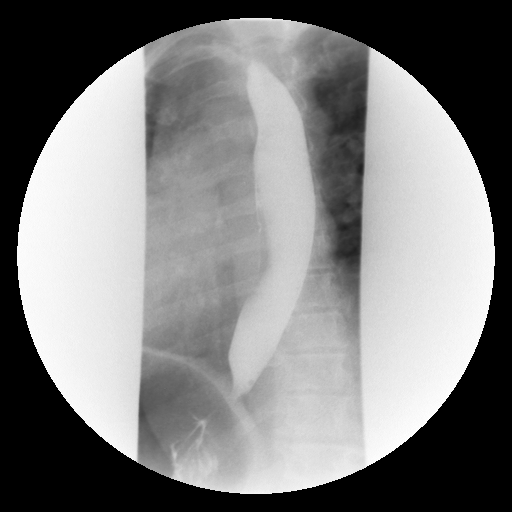

[Series 13: run · 1 of 1 slices shown (9 of 13)]
[im 1/1]
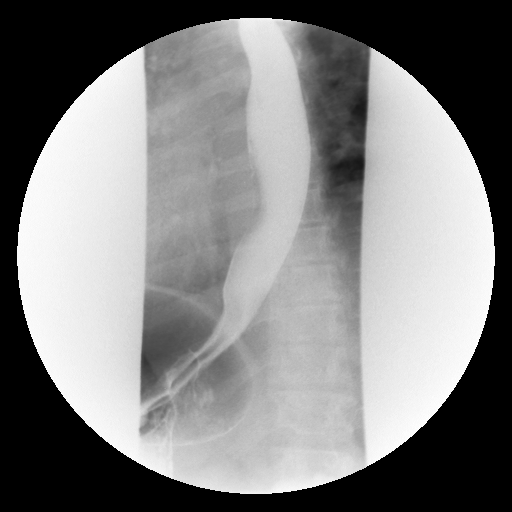

[Series 14: run · 1 of 1 slices shown (10 of 13)]
[im 1/1]
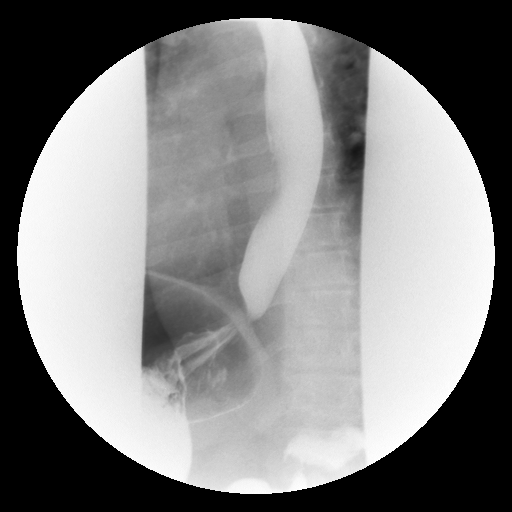

[Series 15: run · 1 of 1 slices shown (11 of 13)]
[im 1/1]
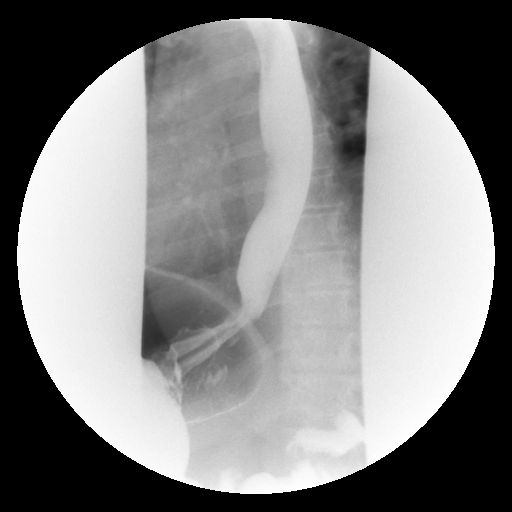

[Series 17: run · 1 of 1 slices shown (12 of 13)]
[im 1/1]
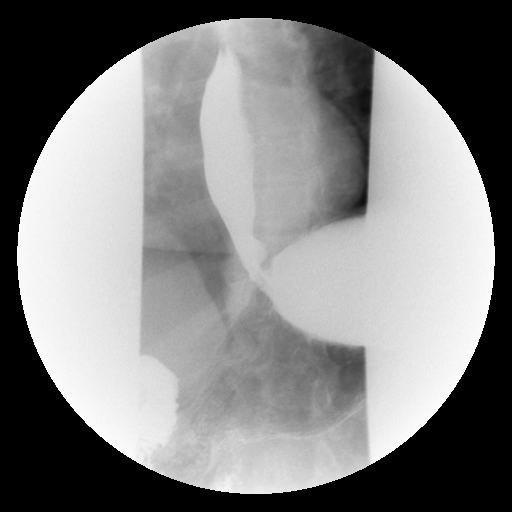

[Series 18: run · 1 of 1 slices shown (13 of 13)]
[im 1/1]
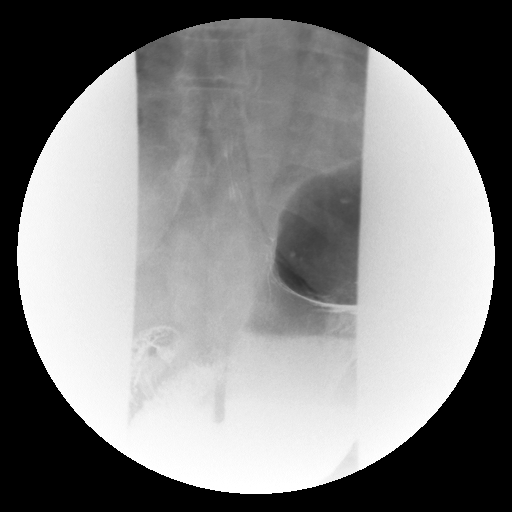

[19 of 24 positions shown; findings below may reference images not displayed]

FINDINGS: Initially double-contrast barium swallow was performed. The mucosa
of the esophagus is unremarkable. A single contrast study shows the
swallowing mechanism to be normal. There is very minimal indentation
of the left lower cervical esophagus which may be due to thyroid
nodules. Esophageal peristalsis is normal. No hiatal hernia is seen.
There is moderate gastroesophageal reflux demonstrated. A barium
pill was given at the end of the study which passed into the stomach
without delay.
IMPRESSION: 1. Moderate gastroesophageal reflux. Barium pill passes into the
stomach without delay.
2. Minimal indentation upon the lower cervical esophagus may be due
to previously demonstrated thyroid nodules.

## 2017-05-19 ENCOUNTER — Other Ambulatory Visit: Payer: Self-pay | Admitting: Family Medicine

## 2017-08-08 ENCOUNTER — Other Ambulatory Visit: Payer: Self-pay | Admitting: Family Medicine

## 2017-08-08 DIAGNOSIS — I10 Essential (primary) hypertension: Secondary | ICD-10-CM

## 2017-08-11 ENCOUNTER — Other Ambulatory Visit: Payer: Self-pay | Admitting: *Deleted

## 2017-08-11 ENCOUNTER — Telehealth: Payer: Self-pay | Admitting: Family Medicine

## 2017-08-11 DIAGNOSIS — I10 Essential (primary) hypertension: Secondary | ICD-10-CM

## 2017-08-11 MED ORDER — ATENOLOL 25 MG PO TABS: 25.0000 mg | ORAL_TABLET | Freq: Every day | ORAL | 0 refills | Status: DC

## 2017-08-11 NOTE — Telephone Encounter (Signed)
Copied from [place] [phone]. Topic: General - Other >> Aug 11, 2017  8:15 AM Lolita Rieger, RMA wrote: Reason for CRM: Pt stated that she has one refill which expired on the 7th but she currently does not have insurance and would like to know if the doctor can reinstate that one refill Please call pt at [phone]

## 2017-08-11 NOTE — Telephone Encounter (Signed)
Spoke with patient advised this is the last refill that will be called in.  She will need to schedule an appointment. Patient voiced understanding

## 2017-09-04 ENCOUNTER — Encounter: Payer: Self-pay | Admitting: Family Medicine

## 2017-09-18 ENCOUNTER — Other Ambulatory Visit: Payer: Self-pay | Admitting: Family Medicine

## 2017-09-18 DIAGNOSIS — I10 Essential (primary) hypertension: Secondary | ICD-10-CM

## 2017-10-08 ENCOUNTER — Other Ambulatory Visit: Payer: Self-pay | Admitting: Family Medicine

## 2017-10-08 DIAGNOSIS — F4323 Adjustment disorder with mixed anxiety and depressed mood: Secondary | ICD-10-CM

## 2017-10-09 NOTE — Telephone Encounter (Signed)
Left message for pt to call back to verify pharmacy. There is an active prescription and refills at the [facility]- A request was sent from [facility]. Waiting for pt to clarify.

## 2017-10-17 ENCOUNTER — Telehealth: Payer: Self-pay

## 2017-10-17 NOTE — Telephone Encounter (Signed)
Phone call to patient in reply to call center message. She states she is out of metformin and chlorthalidone medication. Doesn't have insurance currently. Has been out of metformin for two months and diuretic (has been using mom's) for a month.   Patient last seen in clinic on 08/08/2016 by Dr. Brigitte Pulse.   Patient advised to come in for office visit since it has been a year or more. She will call back to schedule.

## 2017-10-17 NOTE — Telephone Encounter (Signed)
Phone call to patient. Relayed that she is due for an office visit to refill Celexa. She will call back to schedule office visit.

## 2017-10-17 NOTE — Telephone Encounter (Signed)
Copied from [facility]. Topic: Inquiry >> Oct 16, 2017  1:29 PM Tracey Reed wrote: Reason for CRM: Patient called wanting to speak with someone regarding a medication that was sent to her pharmacy. Patient did not have the name of the medication. Someone from the office, please call patient to verify if the medication was sent?       Thank You!!!

## 2017-10-21 ENCOUNTER — Other Ambulatory Visit: Payer: Self-pay | Admitting: Family Medicine

## 2017-10-21 DIAGNOSIS — F4323 Adjustment disorder with mixed anxiety and depressed mood: Secondary | ICD-10-CM

## 2017-10-23 ENCOUNTER — Telehealth: Payer: Self-pay | Admitting: Family Medicine

## 2017-10-23 NOTE — Telephone Encounter (Addendum)
Called patient. No answer, left message for patient to call and schedule an appointment for med refills.  LOV  08/08/16 Provider: [name], MD

## 2017-11-25 ENCOUNTER — Emergency Department (HOSPITAL_COMMUNITY)
Admission: EM | Admit: 2017-11-25 | Discharge: 2017-11-25 | Disposition: A | Discharge status: Home / Self Care | Payer: Self-pay | Attending: Emergency Medicine | Admitting: Emergency Medicine

## 2017-11-25 ENCOUNTER — Encounter (HOSPITAL_COMMUNITY): Payer: Self-pay

## 2017-11-25 ENCOUNTER — Other Ambulatory Visit: Payer: Self-pay

## 2017-11-25 ENCOUNTER — Emergency Department (HOSPITAL_COMMUNITY): Payer: Self-pay

## 2017-11-25 DIAGNOSIS — E119 Type 2 diabetes mellitus without complications: Secondary | ICD-10-CM | POA: Insufficient documentation

## 2017-11-25 DIAGNOSIS — F419 Anxiety disorder, unspecified: Secondary | ICD-10-CM | POA: Insufficient documentation

## 2017-11-25 DIAGNOSIS — Z760 Encounter for issue of repeat prescription: Secondary | ICD-10-CM | POA: Insufficient documentation

## 2017-11-25 DIAGNOSIS — Z7984 Long term (current) use of oral hypoglycemic drugs: Secondary | ICD-10-CM | POA: Insufficient documentation

## 2017-11-25 DIAGNOSIS — Z79899 Other long term (current) drug therapy: Secondary | ICD-10-CM | POA: Insufficient documentation

## 2017-11-25 DIAGNOSIS — I10 Essential (primary) hypertension: Secondary | ICD-10-CM | POA: Insufficient documentation

## 2017-11-25 HISTORY — DX: Other allergic rhinitis: J30.89

## 2017-11-25 HISTORY — DX: Type 2 diabetes mellitus without complications: E11.9

## 2017-11-25 LAB — CBC WITH DIFFERENTIAL/PLATELET
Basophils Absolute: 0 10*3/uL (ref 0.0–0.1)
Basophils Relative: 0 %
Eosinophils Absolute: 0.1 10*3/uL (ref 0.0–0.7)
Eosinophils Relative: 1 %
HCT: 39.2 % (ref 36.0–46.0)
Hemoglobin: 13 g/dL (ref 12.0–15.0)
Lymphocytes Relative: 45 %
Lymphs Abs: 5.2 10*3/uL — ABNORMAL HIGH (ref 0.7–4.0)
MCH: 26.7 pg (ref 26.0–34.0)
MCHC: 33.2 g/dL (ref 30.0–36.0)
MCV: 80.7 fL (ref 78.0–100.0)
Monocytes Absolute: 0.5 10*3/uL (ref 0.1–1.0)
Monocytes Relative: 5 %
Neutro Abs: 5.6 10*3/uL (ref 1.7–7.7)
Neutrophils Relative %: 49 %
Platelets: 425 10*3/uL — ABNORMAL HIGH (ref 150–400)
RBC: 4.86 MIL/uL (ref 3.87–5.11)
RDW: 15.7 % — ABNORMAL HIGH (ref 11.5–15.5)
WBC: 11.4 10*3/uL — ABNORMAL HIGH (ref 4.0–10.5)

## 2017-11-25 LAB — BASIC METABOLIC PANEL
Anion gap: 10 (ref 5–15)
BUN: 18 mg/dL (ref 6–20)
CO2: 28 mmol/L (ref 22–32)
Calcium: 9.3 mg/dL (ref 8.9–10.3)
Chloride: 102 mmol/L (ref 101–111)
Creatinine, Ser: 1.35 mg/dL — ABNORMAL HIGH (ref 0.44–1.00)
GFR calc Af Amer: 53 mL/min — ABNORMAL LOW (ref >60)
GFR calc non Af Amer: 46 mL/min — ABNORMAL LOW (ref >60)
Glucose, Bld: 111 mg/dL — ABNORMAL HIGH (ref 65–99)
Potassium: 3.6 mmol/L (ref 3.5–5.1)
Sodium: 140 mmol/L (ref 135–145)

## 2017-11-25 MED ORDER — CHLORTHALIDONE 25 MG PO TABS: 25.0000 mg | ORAL_TABLET | Freq: Every day | ORAL | Status: DC

## 2017-11-25 MED ORDER — CITALOPRAM HYDROBROMIDE 10 MG PO TABS: 10.0000 mg | ORAL_TABLET | Freq: Every day | ORAL | 0 refills | Status: DC

## 2017-11-25 MED ORDER — CHLORTHALIDONE 25 MG PO TABS: 25.0000 mg | ORAL_TABLET | Freq: Every day | ORAL | 0 refills | Status: DC

## 2017-11-25 MED ORDER — METFORMIN HCL 500 MG PO TABS: 500.0000 mg | ORAL_TABLET | Freq: Every day | ORAL | 0 refills | Status: DC

## 2017-11-25 MED ORDER — CLONAZEPAM 0.5 MG PO TABS
0.5000 mg | ORAL_TABLET | Freq: Once | ORAL | Status: AC
  Administered 2017-11-25: 0.5 mg via ORAL
  Filled 2017-11-25: qty 1

## 2017-11-25 MED ORDER — ATENOLOL 25 MG PO TABS: 25.0000 mg | ORAL_TABLET | Freq: Every day | ORAL | 0 refills | Status: DC

## 2017-11-25 NOTE — Discharge Instructions (Signed)
Your chest x-ray today is normal. Your blood work is normal. Your blood sugar today is 111. Call and schedule an appointment with your doctor.

## 2017-11-25 NOTE — ED Provider Notes (Signed)
[facility] DEPT Provider Note   CSN: [id number] Arrival date & time: 11/25/17  1511     History   Chief Complaint Chief Complaint  Patient presents with  . Medication Refill  . Shortness of Breath    HPI Tracey Reed is a 47 y.o. female who presents to the ED with c/o shortness of breath that is worse when she is outside. Patient with hx of anxiety and has been out of her medication for over a month. Patient also with hx of allergies and has not been on medication. She is not sure if she feels short of breath sometimes due to allergies or anxiety. She denies chest pain or shortness of breath. Patient states she is out of her medication for diabetes and all her other medications. She called her PCP but they would not refill until she comes in for a visit. Patient states her insurance does not go into effect for another month and she can not go back to her PCP until she has insurance. Patient reports having had a cough with yellow sputum for a long time and states she has not had a chest x-ray.  HPI  Past Medical History:  Diagnosis Date  . Anxiety   . Diabetes mellitus without complication ([place])   . Environmental and seasonal allergies   . Hypertension     Patient Active Problem List   Diagnosis Date Noted  . Vitamin D deficiency 11/26/2015  . Type 2 diabetes mellitus ([place]) 11/26/2015  . Esophageal reflux 11/26/2015  . Environmental allergies 11/26/2015  . IBS (irritable bowel syndrome) 11/26/2015  . Adjustment disorder with mixed anxiety and depressed mood 11/26/2015  . Attention-deficit hyperactivity disorder, predominantly hyperactive type 11/26/2015  . ADD (attention deficit disorder) 07/30/2015  . Multiple thyroid nodules 12/19/2014  . Essential hypertension 11/02/2012  . Anxiety state 11/02/2012  . Morbid obesity ([place]) 11/02/2012    Past Surgical History:  Procedure Laterality Date  . ENDOMETRIAL ABLATION    . TUBAL  LIGATION       OB History   None      Home Medications    Prior to Admission medications   Medication Sig Start Date End Date Taking? Authorizing Provider  amphetamine-dextroamphetamine (ADDERALL XR) 20 MG 24 hr capsule Take 1 capsule (20 mg total) by mouth every morning. 08/08/16   Shawnee Knapp, MD  amphetamine-dextroamphetamine (ADDERALL XR) 20 MG 24 hr capsule Take 1 capsule (20 mg total) by mouth every morning. 08/08/16   Shawnee Knapp, MD  amphetamine-dextroamphetamine (ADDERALL XR) 20 MG 24 hr capsule Take 1 capsule (20 mg total) by mouth every morning. 08/08/16   Shawnee Knapp, MD  atenolol (TENORMIN) 25 MG tablet Take 1 tablet (25 mg total) by mouth daily. 11/25/17   Ashley Murrain, NP  blood glucose meter kit and supplies KIT Dispense based on patient and insurance preference. Use up to four times daily as directed. (FOR ICD-9 250.00, 250.01). 11/26/15   Robyn Haber, MD  chlorthalidone (HYGROTON) 25 MG tablet Take 1 tablet (25 mg total) by mouth daily. 11/25/17   Ashley Murrain, NP  citalopram (CELEXA) 10 MG tablet Take 1 tablet (10 mg total) by mouth daily. 11/25/17   Ashley Murrain, NP  clonazePAM (KLONOPIN) 0.5 MG tablet Take 1 tablet (0.5 mg total) by mouth 2 (two) times daily as needed (panic attacks). 08/08/16   Shawnee Knapp, MD  cyclobenzaprine (FLEXERIL) 10 MG tablet Take 1 tablet (10 mg total)  by mouth 3 (three) times daily as needed. 09/15/16   Shawnee Knapp, MD  glucose blood test strip Use as instructed UP TO 4 TIMES A DAY 11/26/15   Robyn Haber, MD  hydrOXYzine (ATARAX/VISTARIL) 25 MG tablet Take 1 tablet (25 mg total) by mouth every 4 (four) hours as needed for anxiety or itching. For itching and hives 11/26/15   Robyn Haber, MD  ibuprofen (ADVIL,MOTRIN) 800 MG tablet Take 1 tablet (800 mg total) by mouth every 8 (eight) hours as needed. 08/08/16   Shawnee Knapp, MD  levocetirizine (XYZAL) 5 MG tablet TAKE 1 TABLET BY MOUTH EVERY EVENING 11/29/16   Shawnee Knapp, MD  lubiprostone  (AMITIZA) 24 MCG capsule Take 1 capsule (24 mcg total) by mouth 2 (two) times daily with a meal. 08/08/16   Shawnee Knapp, MD  metFORMIN (GLUCOPHAGE) 500 MG tablet Take 1 tablet (500 mg total) by mouth daily with breakfast. 11/25/17   Ashley Murrain, NP  montelukast (SINGULAIR) 10 MG tablet Take 1 tablet (10 mg total) by mouth at bedtime. 08/08/16   Shawnee Knapp, MD  TRUEPLUS LANCETS 30G MISC USE 4 TIMES DAILY 11/26/15   Robyn Haber, MD    Family History Family History  Problem Relation Age of Onset  . Hypertension Mother   . Cancer Maternal Grandmother   . Thyroid disease Neg Hx     Social History Social History   Tobacco Use  . Smoking status: Never Smoker  . Smokeless tobacco: Never Used  Substance Use Topics  . Alcohol use: Yes    Comment: occasional  . Drug use: No     Allergies   Clindamycin/lincomycin   Review of Systems Review of Systems  Constitutional: Negative for chills and fever.  HENT: Positive for congestion. Negative for sore throat.   Eyes: Negative for visual disturbance.  Respiratory: Positive for cough and shortness of breath. Negative for chest tightness, wheezing and stridor.   Cardiovascular: Negative for chest pain.  Gastrointestinal: Negative for abdominal pain, nausea and vomiting.  Musculoskeletal: Negative for back pain.  Skin: Negative for rash.  Neurological: Negative for syncope and headaches.  Psychiatric/Behavioral: Negative for confusion.     Physical Exam Updated Vital Signs BP (!) 147/72 (BP Location: Left Arm)   Pulse 78   Temp 98.6 F (37 C) (Oral)   Resp 18   Ht _0  (1.651 m)   Wt 126.1 kg (278 lb)   SpO2 100%   BMI 46.26 kg/m   Physical Exam  Constitutional: She is oriented to person, place, and time. She appears well-developed and well-nourished. No distress.  Patient relaxed and speaking in full sentences without difficulty.  HENT:  Head: Normocephalic and atraumatic.  Right Ear: Tympanic membrane normal.  Left  Ear: Tympanic membrane normal.  Nose: Rhinorrhea present.  Mouth/Throat: Oropharynx is clear and moist.  Eyes: Pupils are equal, round, and reactive to light. Conjunctivae and EOM are normal.  Neck: Neck supple.  Cardiovascular: Normal rate and regular rhythm.  Pulmonary/Chest: Effort normal. No respiratory distress. She has no wheezes. She has no rales. She exhibits no tenderness.  Abdominal: Soft. There is no tenderness.  Musculoskeletal: Normal range of motion.  Neurological: She is alert and oriented to person, place, and time. No cranial nerve deficit.  Skin: Skin is warm and dry.  Psychiatric:  Patient does not appear anxious at this time.   Nursing note and vitals reviewed.    ED Treatments / Results  Labs (all  labs ordered are listed, but only abnormal results are displayed) Labs Reviewed  CBC WITH DIFFERENTIAL/PLATELET - Abnormal; Notable for the following components:      Result Value   WBC 11.4 (*)    RDW 15.7 (*)    Platelets 425 (*)    Lymphs Abs 5.2 (*)    All other components within normal limits  BASIC METABOLIC PANEL - Abnormal; Notable for the following components:   Glucose, Bld 111 (*)    Creatinine, Ser 1.35 (*)    GFR calc non Af Amer 46 (*)    GFR calc Af Amer 53 (*)    All other components within normal limits    EKG None  Radiology Dg Chest 2 View  Result Date: 11/25/2017 CLINICAL DATA:  Pt reports increasing shortness of breath and tightness in her throat over the past month. Pt says that she has issues with her thyroid but denies having any other problems. Hx hypertension EXAM: CHEST - 2 VIEW COMPARISON:  07/15/2007 FINDINGS: The heart size and mediastinal contours are within normal limits. Both lungs are clear. No pleural effusion or pneumothorax. The visualized skeletal structures are unremarkable. IMPRESSION: No active cardiopulmonary disease. Electronically Signed   By: Lajean Manes M.D.   On: 11/25/2017 20:09    Procedures Procedures  (including critical care time)  Medications Ordered in ED Medications  clonazePAM (KLONOPIN) tablet 0.5 mg (has no administration in time range)     Initial Impression / Assessment and Plan / ED Course  I have reviewed the triage vital signs and the nursing notes. 47 y.o. female here with c/o shortness of breath and requesting refill of medications. I have reviewed the patient's lab results and x-ray results and discussed findings with the patient and plan of care. I have written refills for the patient's medications except for the Klonopin which is a controlled substance and the patient does not have a mental health provider or a PCP at this time. Patient given information for f/u with [facility]. Patient upset and request to speak with supervising MD. Dr. Sabra Heck in to see the patient and discuss plan of care. Patient stable for d/c.    Final Clinical Impressions(s) / ED Diagnoses   Final diagnoses:  Medication refill  Anxiety    ED Discharge Orders        Ordered    atenolol (TENORMIN) 25 MG tablet  Daily     11/25/17 2100    metFORMIN (GLUCOPHAGE) 500 MG tablet  Daily with breakfast     11/25/17 2100    citalopram (CELEXA) 10 MG tablet  Daily     11/25/17 2100    chlorthalidone (HYGROTON) 25 MG tablet  Daily     11/25/17 2112       Debroah Baller [place], NP 11/25/17 2250    Noemi Chapel, MD 11/26/17 1451

## 2017-11-25 NOTE — ED Triage Notes (Addendum)
PT c/o shob on/off x 3 weeks to a month.  States has lost medical insurance this year and hasn't been able to get her medications refilled.  Has been under a lot of stress and is trying to manage her anxiety on her own w/OTC "sunny mood" w/ some relief but also "weird side effects"  Has had a lot of mucous w/cough x years but the OTC meds aren't as effective as her RX meds.  States her new medical insurance will kick in next month.  Speaking in full, complete sentences w/o shob in triage.  States she feels flare ups of shob, esp when she feels anxious.

## 2017-11-25 NOTE — ED Notes (Signed)
Pt has come back into the dept and asked to have her xray results.  She went back to her room and [facility], Np has been made aware.

## 2017-11-25 NOTE — ED Notes (Signed)
Up on d/c pt was crying saying that she isn't getting anything she has come here for, correcting herself saying that the Rx's that she is getting are needed but that she needs something to help her anxiety.  I was trying to get her d/c vital signs but she demanded that the cuff be taken off because she was so frustrated that she was being labeled a drug addict.  She took her d/c papers and said she should have been told she wouldn't be getting the anxiety medications at the beginning of her stay instead of waiting 6 hours and not getting the help she needs.  It was explained to her that she needs to see her PMD that can monitor her long-term and keep up with her progress with that particular controlled class 4 medication.  She became more upset and continued say that she's not a drug addict. She didn't respond when asked if she'd sign for her d/c and walked out w/her mother.

## 2017-11-25 NOTE — ED Provider Notes (Signed)
The patient is a 47 year old female who presents requesting refills of her medications.  She states she has been out of her anxiety medicine for over 2 months, she came to the emergency department expecting to have all of her medications refilled.  She has not been to local family doctors or psychiatry.  She will have insurance soon and is waiting for that to see somebody else.  The patient appears to have a normal mental status on exam.  She is interactive, she is upset because she was told that we are not refilling narcotic medications for her.  She was refilled all of her other medications, she will be given a single dose of Klonopin prior to discharge.  The patient is in agreement, she has follow-up information for outpatient psychiatry.  Medical screening examination/treatment/procedure(s) were conducted as a shared visit with non-physician practitioner(s) and myself.  I personally evaluated the patient during the encounter.  Clinical Impression:   Final diagnoses:  Medication refill  Anxiety         Tracey Chapel, MD 11/26/17 1451

## 2018-01-29 ENCOUNTER — Encounter: Payer: Self-pay | Admitting: Family Medicine

## 2018-01-30 ENCOUNTER — Encounter: Payer: Self-pay | Admitting: Family Medicine

## 2018-01-30 ENCOUNTER — Ambulatory Visit: Payer: BLUE CROSS/BLUE SHIELD | Admitting: Family Medicine

## 2018-01-30 ENCOUNTER — Other Ambulatory Visit: Payer: Self-pay

## 2018-01-30 VITALS — BP 136/84 | HR 77 | Temp 98.7°F | Resp 17 | Ht 65.0 in | Wt 279.6 lb

## 2018-01-30 DIAGNOSIS — F4323 Adjustment disorder with mixed anxiety and depressed mood: Secondary | ICD-10-CM | POA: Diagnosis not present

## 2018-01-30 DIAGNOSIS — D473 Essential (hemorrhagic) thrombocythemia: Secondary | ICD-10-CM | POA: Diagnosis not present

## 2018-01-30 DIAGNOSIS — K581 Irritable bowel syndrome with constipation: Secondary | ICD-10-CM

## 2018-01-30 DIAGNOSIS — R7303 Prediabetes: Secondary | ICD-10-CM | POA: Diagnosis not present

## 2018-01-30 DIAGNOSIS — E042 Nontoxic multinodular goiter: Secondary | ICD-10-CM | POA: Diagnosis not present

## 2018-01-30 DIAGNOSIS — Z9109 Other allergy status, other than to drugs and biological substances: Secondary | ICD-10-CM | POA: Diagnosis not present

## 2018-01-30 DIAGNOSIS — D75839 Thrombocytosis, unspecified: Secondary | ICD-10-CM

## 2018-01-30 DIAGNOSIS — F411 Generalized anxiety disorder: Secondary | ICD-10-CM | POA: Diagnosis not present

## 2018-01-30 DIAGNOSIS — F901 Attention-deficit hyperactivity disorder, predominantly hyperactive type: Secondary | ICD-10-CM

## 2018-01-30 DIAGNOSIS — E1165 Type 2 diabetes mellitus with hyperglycemia: Secondary | ICD-10-CM

## 2018-01-30 DIAGNOSIS — K219 Gastro-esophageal reflux disease without esophagitis: Secondary | ICD-10-CM | POA: Diagnosis not present

## 2018-01-30 DIAGNOSIS — I10 Essential (primary) hypertension: Secondary | ICD-10-CM | POA: Diagnosis not present

## 2018-01-30 MED ORDER — AMPHETAMINE-DEXTROAMPHET ER 20 MG PO CP24: 20.0000 mg | ORAL_CAPSULE | ORAL | 0 refills | Status: DC

## 2018-01-30 MED ORDER — HYDROXYZINE HCL 25 MG PO TABS: 25.0000 mg | ORAL_TABLET | ORAL | 0 refills | Status: DC | PRN

## 2018-01-30 MED ORDER — LEVOCETIRIZINE DIHYDROCHLORIDE 5 MG PO TABS: 5.0000 mg | ORAL_TABLET | Freq: Every evening | ORAL | 1 refills | Status: AC

## 2018-01-30 MED ORDER — LINACLOTIDE 290 MCG PO CAPS: 290.0000 ug | ORAL_CAPSULE | Freq: Every day | ORAL | 3 refills | Status: DC

## 2018-01-30 MED ORDER — MONTELUKAST SODIUM 10 MG PO TABS: 10.0000 mg | ORAL_TABLET | Freq: Every day | ORAL | 3 refills | Status: AC

## 2018-01-30 MED ORDER — METFORMIN HCL ER 500 MG PO TB24: 500.0000 mg | ORAL_TABLET | Freq: Every day | ORAL | 1 refills | Status: DC

## 2018-01-30 MED ORDER — TRIAMTERENE-HCTZ 37.5-25 MG PO TABS: 1.0000 | ORAL_TABLET | Freq: Every day | ORAL | 1 refills | Status: DC

## 2018-01-30 MED ORDER — CITALOPRAM HYDROBROMIDE 10 MG PO TABS: 10.0000 mg | ORAL_TABLET | Freq: Every day | ORAL | 1 refills | Status: DC

## 2018-01-30 MED ORDER — ATENOLOL 25 MG PO TABS: 25.0000 mg | ORAL_TABLET | Freq: Every day | ORAL | 1 refills | Status: DC

## 2018-01-30 MED ORDER — BLOOD GLUCOSE MONITOR KIT: PACK | 0 refills | Status: AC

## 2018-01-30 MED ORDER — AMPHETAMINE-DEXTROAMPHET ER 20 MG PO CP24: 20.0000 mg | ORAL_CAPSULE | ORAL | 0 refills | Status: AC

## 2018-01-30 MED ORDER — CLONAZEPAM 0.5 MG PO TABS: 0.5000 mg | ORAL_TABLET | Freq: Two times a day (BID) | ORAL | 1 refills | Status: DC | PRN

## 2018-01-30 MED ORDER — GLUCOSE BLOOD VI STRP: ORAL_STRIP | 3 refills | Status: AC

## 2018-01-30 NOTE — Progress Notes (Addendum)
Subjective:  By signing my name below, I, Tracey Reed, attest that this documentation has been prepared under the direction and in the presence of Tracey Cheadle, MD. Electronically Signed: Moises Reed, [place]. 01/30/2018 , 4:15 PM .  Patient was seen in Room 3 .   Patient ID: Tracey Reed, female    DOB: [birth year], 47 y.o.   MRN: [id number] Chief Complaint  Patient presents with  . Medication Refill    adderall xr 20 mg, atenolol, celexa, klonopin, flexeril, atarax, xyzal, glucophage and singular.  Pt would like to speak with dr about switching amitiza and hygroton   HPI Tracey Reed is a 46 y.o. female who presents to Primary Care at [facility] request medication refill. She's been doing pretty well. She was started on her new insurance recently. She last ate at around 11:00 AM. She's currently working days.   Diabetes She ran out of her metformin about a month ago. She hasn't checked her Reed sugars recently; last checked 1 month ago, and it was 101. She has testing materials at home, using [employer] meter. She's due for pneumovax, but would like to defer until next visit. Her kidney function was 90% at last visit, but it was down to 53% on ER visit 3 months ago. She notes taking a liquid laxative (magnesium citrate), usually takes half bottle once or twice a week; but with worsening constipation she's been taking it up to a whole bottle.   HTN She's been off of her chlorthalidone for a week now. She denies urinary symptoms. She does note retaining weight after stopping her chlorthalidone. She notes receiving atenolol in the ER.   Allergies She takes Xyzal and Singulair daily for environmental allergies.   IBS with constipation predominant She reports constipation gets so backed up, that she's been taking liquid laxative (magnesium citrate).   Anxiety and depression She takes Celexa 10 mg, and not out of it. She was taking klonopin daily, but has been spreading the  doses out lately. She reports infrequent use of alcohol. She rarely takes hydroxyzine for her hives.   Supplements She's not taking OTC Vitamin D supplements.   Past Medical History:  Diagnosis Date  . Anxiety   . Diabetes mellitus without complication ([place])   . Environmental and seasonal allergies   . Hypertension    Past Surgical History:  Procedure Laterality Date  . ENDOMETRIAL ABLATION    . TUBAL LIGATION     Prior to Admission medications   Medication Sig Start Date End Date Taking? Authorizing Provider  amphetamine-dextroamphetamine (ADDERALL XR) 20 MG 24 hr capsule Take 1 capsule (20 mg total) by mouth every morning. 08/08/16   Tracey Knapp, MD  amphetamine-dextroamphetamine (ADDERALL XR) 20 MG 24 hr capsule Take 1 capsule (20 mg total) by mouth every morning. 08/08/16   Tracey Knapp, MD  amphetamine-dextroamphetamine (ADDERALL XR) 20 MG 24 hr capsule Take 1 capsule (20 mg total) by mouth every morning. 08/08/16   Tracey Knapp, MD  atenolol (TENORMIN) 25 MG tablet Take 1 tablet (25 mg total) by mouth daily. 11/25/17   Tracey Murrain, NP  Reed glucose meter kit and supplies KIT Dispense based on patient and insurance preference. Use up to four times daily as directed. (FOR ICD-9 250.00, 250.01). 11/26/15   Tracey Haber, MD  chlorthalidone (HYGROTON) 25 MG tablet Take 1 tablet (25 mg total) by mouth daily. 11/25/17   Tracey Murrain, NP  citalopram (CELEXA) 10 MG tablet Take 1  tablet (10 mg total) by mouth daily. 11/25/17   Tracey Murrain, NP  clonazePAM (KLONOPIN) 0.5 MG tablet Take 1 tablet (0.5 mg total) by mouth 2 (two) times daily as needed (panic attacks). 08/08/16   Tracey Knapp, MD  cyclobenzaprine (FLEXERIL) 10 MG tablet Take 1 tablet (10 mg total) by mouth 3 (three) times daily as needed. 09/15/16   Tracey Knapp, MD  glucose Reed test strip Use as instructed UP TO 4 TIMES A DAY 11/26/15   Tracey Haber, MD  hydrOXYzine (ATARAX/VISTARIL) 25 MG tablet Take 1 tablet (25 mg total) by mouth  every 4 (four) hours as needed for anxiety or itching. For itching and hives 11/26/15   Tracey Haber, MD  ibuprofen (ADVIL,MOTRIN) 800 MG tablet Take 1 tablet (800 mg total) by mouth every 8 (eight) hours as needed. 08/08/16   Tracey Knapp, MD  levocetirizine (XYZAL) 5 MG tablet TAKE 1 TABLET BY MOUTH EVERY EVENING 11/29/16   Tracey Knapp, MD  lubiprostone (AMITIZA) 24 MCG capsule Take 1 capsule (24 mcg total) by mouth 2 (two) times daily with a meal. 08/08/16   Tracey Knapp, MD  metFORMIN (GLUCOPHAGE) 500 MG tablet Take 1 tablet (500 mg total) by mouth daily with breakfast. 11/25/17   Tracey Murrain, NP  montelukast (SINGULAIR) 10 MG tablet Take 1 tablet (10 mg total) by mouth at bedtime. 08/08/16   Tracey Knapp, MD  TRUEPLUS LANCETS 30G MISC USE 4 TIMES DAILY 11/26/15   Tracey Haber, MD   Allergies  Allergen Reactions  . Clindamycin/Lincomycin Rash   Family History  Problem Relation Age of Onset  . Hypertension Mother   . Cancer Maternal Grandmother   . Thyroid disease Neg Hx    Social History   Socioeconomic History  . Marital status: Divorced    Spouse name: Not on file  . Number of children: Not on file  . Years of education: Not on file  . Highest education level: Not on file  Occupational History  . Not on file  Social Needs  . Financial resource strain: Not on file  . Food insecurity:    Worry: Not on file    Inability: Not on file  . Transportation needs:    Medical: Not on file    Non-medical: Not on file  Tobacco Use  . Smoking status: Never Smoker  . Smokeless tobacco: Never Used  Substance and Sexual Activity  . Alcohol use: Yes    Comment: occasional  . Drug use: No  . Sexual activity: Not on file  Lifestyle  . Physical activity:    Days per week: Not on file    Minutes per session: Not on file  . Stress: Not on file  Relationships  . Social connections:    Talks on phone: Not on file    Gets together: Not on file    Attends religious service: Not on file     Active member of club or organization: Not on file    Attends meetings of clubs or organizations: Not on file    Relationship status: Not on file  Other Topics Concern  . Not on file  Social History Narrative  . Not on file   Depression screen [facility] 2/9 01/30/2018 08/08/2016 11/26/2015 11/26/2015 07/30/2015  Decreased Interest 0 0 0 0 0  Down, Depressed, Hopeless 0 0 0 0 0  PHQ - 2 Score 0 0 0 0 0  Altered sleeping - - - - -  Tired, decreased energy - - - - -  Change in appetite - - - - -  Feeling bad or failure about yourself  - - - - -  Trouble concentrating - - - - -  Moving slowly or fidgety/restless - - - - -  Suicidal thoughts - - - - -  PHQ-9 Score - - - - -  Difficult doing work/chores - - - - -    Review of Systems  Constitutional: Negative for chills, fatigue, fever and unexpected weight change.  Respiratory: Negative for cough.   Gastrointestinal: Negative for constipation, diarrhea, nausea and vomiting.  Skin: Negative for rash and wound.  Neurological: Negative for dizziness, weakness and headaches.       Objective:   Physical Exam  Constitutional: She is oriented to person, place, and time. She appears well-developed and well-nourished. No distress.  HENT:  Head: Normocephalic and atraumatic.  Eyes: Pupils are equal, round, and reactive to light. EOM are normal.  Neck: Neck supple.  Cardiovascular: Normal rate.  Pulmonary/Chest: Effort normal. No respiratory distress.  Musculoskeletal: Normal range of motion.  Neurological: She is alert and oriented to person, place, and time.  Skin: Skin is warm and dry.  Psychiatric: She has a normal mood and affect. Her behavior is normal.  Nursing note and vitals reviewed.   BP 136/84 (BP Location: Right Arm, Patient Position: Sitting, Cuff Size: Large)   Pulse 77   Temp 98.7 F (37.1 C) (Oral)   Resp 17   Ht 5' 5"  (1.651 m)   Wt 279 lb 9.6 oz (126.8 kg)   SpO2 97%   BMI 46.53 kg/m        Assessment & Plan:    Scripts can be filled today (7/2), 8/1 and 8/31 1. Type 2 diabetes mellitus with hyperglycemia, without long-term current use of insulin ([place])   2. Thrombocytosis ([place])   3. Environmental allergies   4. Attention-deficit hyperactivity disorder, predominantly hyperactive type   5. Essential hypertension   6. Gastroesophageal reflux disease, esophagitis presence not specified   7. Irritable bowel syndrome with constipation - can't afford amitiza - does not appear linzess is covered either but pt would like me to send it in for her anyway to see - currently using mag citrate regularly - warned against bowel dependence on laxative - start bid miralax instead and titrate dose as needed.  8. Anxiety state  - Needs to back off from daily klonopin use and just use very sparingly for panic attacks only if on adderall - if using bzd daily, will need to try different ADD treatment, or increase/try different SSRI, or other anxiety trx (hydroxyzine, propranolol, buspar)  9. Morbid obesity ([place])   10. Adjustment disorder with mixed anxiety and depressed mood   11. Prediabetes    Reviewed importance of OV q38mo with her medication regimen and controlled high risk meds. No klonopin or adderall refills w/o OV  Orders Placed This Encounter  Procedures  . Comprehensive metabolic panel  . CBC with Differential/Platelet  . TSH  . Hemoglobin A1c  . HM Diabetes Foot Exam    Meds ordered this encounter  Medications  . amphetamine-dextroamphetamine (ADDERALL XR) 20 MG 24 hr capsule    Sig: Take 1 capsule (20 mg total) by mouth every morning.    Dispense:  30 capsule    Refill:  0  . amphetamine-dextroamphetamine (ADDERALL XR) 20 MG 24 hr capsule    Sig: Take 1 capsule (20 mg total) by mouth  every morning.    Dispense:  30 capsule    Refill:  0  . amphetamine-dextroamphetamine (ADDERALL XR) 20 MG 24 hr capsule    Sig: Take 1 capsule (20 mg total) by mouth every morning.    Dispense:  30 capsule     Refill:  0  . DISCONTD: atenolol (TENORMIN) 25 MG tablet    Sig: Take 1 tablet (25 mg total) by mouth daily.    Dispense:  90 tablet    Refill:  1  . triamterene-hydrochlorothiazide (MAXZIDE-25) 37.5-25 MG tablet    Sig: Take 1 tablet by mouth daily.    Dispense:  90 tablet    Refill:  1  . citalopram (CELEXA) 10 MG tablet    Sig: Take 1 tablet (10 mg total) by mouth daily.    Dispense:  90 tablet    Refill:  1  . clonazePAM (KLONOPIN) 0.5 MG tablet    Sig: Take 1 tablet (0.5 mg total) by mouth 2 (two) times daily as needed (panic attacks).    Dispense:  20 tablet    Refill:  1  . levocetirizine (XYZAL) 5 MG tablet    Sig: Take 1 tablet (5 mg total) by mouth every evening.    Dispense:  90 tablet    Refill:  1  . montelukast (SINGULAIR) 10 MG tablet    Sig: Take 1 tablet (10 mg total) by mouth at bedtime.    Dispense:  90 tablet    Refill:  3  . metFORMIN (GLUCOPHAGE-XR) 500 MG 24 hr tablet    Sig: Take 1 tablet (500 mg total) by mouth daily with breakfast.    Dispense:  90 tablet    Refill:  1  . hydrOXYzine (ATARAX/VISTARIL) 25 MG tablet    Sig: Take 1-2 tablets (25-50 mg total) by mouth every 4 (four) hours as needed for itching (urticaria). For itching and hives    Dispense:  60 tablet    Refill:  0  . Reed glucose meter kit and supplies KIT    Sig: Dispense based on patient and insurance preference. Use up to four times daily as directed. E11.65. Increased testing frequency hyperglycemia    Dispense:  1 each    Refill:  0    Order Specific Question:   Number of strips    Answer:   100    Order Specific Question:   Number of lancets    Answer:   100  . glucose Reed test strip    Sig: Use as instructed UP TO 4 TIMES A DAY. Dx: Increased testing frequency hyperglycemia, HTN    Dispense:  100 each    Refill:  3  . linaclotide (LINZESS) 290 MCG CAPS capsule    Sig: Take 1 capsule (290 mcg total) by mouth daily before breakfast.    Dispense:  30 capsule    Refill:  3    . atenolol (TENORMIN) 25 MG tablet    Sig: Take 1 tablet (25 mg total) by mouth daily.    Dispense:  90 tablet    Refill:  3    I personally performed the services described in this documentation, which was scribed in my presence. The recorded information has been reviewed and considered, and addended by me as needed.   Tracey Reed, M.D.  Primary Care at [facility] [address] [place], [place] [address] [phone] phone [phone] fax  01/31/18 8:32 AM

## 2018-01-30 NOTE — Patient Instructions (Addendum)
IF you received an x-ray today, you will receive an invoice from [facility]. Please contact [facility] at [phone] with questions or concerns regarding your invoice.   IF you received labwork today, you will receive an invoice from [facility]. Please contact [facility] at [phone] with questions or concerns regarding your invoice.   Our billing staff will not be able to assist you with questions regarding bills from these companies.  You will be contacted with the lab results as soon as they are available. The fastest way to get your results is to activate your My Chart account. Instructions are located on the last page of this paperwork. If you have not heard from [place] regarding the results in 2 weeks, please contact this office.    We recommend that you schedule a mammogram for breast cancer screening. Typically, you do not need a referral to do this. Please contact a local imaging center to schedule your mammogram.  [facility] - [phone]  *ask for the Radiology Department [facility] ([place]) - [phone] or [phone]  [facility] - [phone] [place] [phone] [facility] - [phone]  *ask for the [facility] - [phone]  *ask for the Radiology Department [facility] - [phone]  *ask for the [facility] - [phone]    Mindfulness-Based Stress Reduction Mindfulness-based stress reduction (MBSR) is a program that helps people learn to practice mindfulness. Mindfulness is the practice of intentionally paying attention to the present moment. It can be learned and practiced through techniques such as education, breathing exercises, meditation, and yoga. MBSR includes several mindfulness techniques in one program. MBSR works best when you understand the treatment, are willing to try  new things, and can commit to spending time practicing what you learn. MBSR training may include learning about:  How your emotions, thoughts, and reactions affect your body.  New ways to respond to things that cause negative thoughts to start (triggers).  How to notice your thoughts and let go of them.  Practicing awareness of everyday things that you normally do without thinking.  The techniques and goals of different types of meditation.  What are the benefits of MBSR? MBSR can have many benefits, which include helping you to:  Develop self-awareness. This refers to knowing and understanding yourself.  Learn skills and attitudes that help you to participate in your own health care.  Learn new ways to care for yourself.  Be more accepting about how things are, and let things go.  Be less judgmental and approach things with an open mind.  Be patient with yourself and trust yourself more.  MBSR has also been shown to:  Reduce negative emotions, such as depression and anxiety.  Improve memory and focus.  Change how you sense and approach pain.  Boost your body's ability to fight infections.  Help you connect better with other people.  Improve your sense of well-being.  Follow these instructions at home:  Find a local in-person or online MBSR program.  Set aside some time regularly for mindfulness practice.  Find a mindfulness practice that works best for you. This may include one or more of the following: ? Meditation. Meditation involves focusing your mind on a certain thought or activity. ? Breathing awareness exercises. These help you to stay present by focusing on your breath. ? Body scan. For this practice, you lie down and  pay attention to each part of your body from head to toe. You can identify tension and soreness and intentionally relax parts of your body. ? Yoga. Yoga involves stretching and breathing, and it can improve your ability to move and be  flexible. It can also provide an experience of testing your body's limits, which can help you release stress. ? Mindful eating. This way of eating involves focusing on the taste, texture, color, and smell of each bite of food. Because this slows down eating and helps you feel full sooner, it can be an important part of a weight-loss plan.  Find a podcast or recording that provides guidance for breathing awareness, body scan, or meditation exercises. You can listen to these any time when you have a free moment to rest without distractions.  Follow your treatment plan as told by your health care provider. This may include taking regular medicines and making changes to your diet or lifestyle as recommended. How to practice mindfulness To do a basic awareness exercise:  Find a comfortable place to sit.  Pay attention to the present moment. Observe your thoughts, feelings, and surroundings just as they are.  Avoid placing judgment on yourself, your feelings, or your surroundings. Make note of any judgment that comes up, and let it go.  Your mind may wander, and that is okay. Make note of when your thoughts drift, and return your attention to the present moment.  To do basic mindfulness meditation:  Find a comfortable place to sit. This may include a stable chair or a firm floor cushion. ? Sit upright with your back straight. Let your arms fall next to your side with your hands resting on your legs. ? If sitting in a chair, rest your feet flat on the floor. ? If sitting on a cushion, cross your legs in front of you.  Keep your head in a neutral position with your chin dropped slightly. Relax your jaw and rest the tip of your tongue on the roof of your mouth. Drop your gaze to the floor. You can close your eyes if you like.  Breathe normally and pay attention to your breath. Feel the air moving in and out of your nose. Feel your belly expanding and relaxing with each breath.  Your mind may  wander, and that is okay. Make note of when your thoughts drift, and return your attention to your breath.  Avoid placing judgment on yourself, your feelings, or your surroundings. Make note of any judgment or feelings that come up, let them go, and bring your attention back to your breath.  When you are ready, lift your gaze or open your eyes. Pay attention to how your body feels after the meditation.  Where to find more information: You can find more information about MBSR from:  Your health care provider.  Community-based meditation centers or programs.  Programs offered near you.  Summary  Mindfulness-based stress reduction (MBSR) is a program that teaches you how to intentionally pay attention to the present moment. It is used with other treatments to help you cope better with daily stress, emotions, and pain.  MBSR focuses on developing self-awareness, which allows you to respond to life stress without judgment or negative emotions.  MBSR programs may involve learning different mindfulness practices, such as breathing exercises, meditation, yoga, body scan, or mindful eating. Find a mindfulness practice that works best for you, and set aside time for it on a regular basis. This information is not intended to  replace advice given to you by your health care provider. Make sure you discuss any questions you have with your health care provider. Document Released: 11/24/2016 Document Revised: 11/24/2016 Document Reviewed: 11/24/2016 Elsevier Interactive Patient Education  2018 [employer].  Therapeutic Drug Monitoring What is therapeutic drug monitoring? Therapeutic drug monitoring (TDM) is regular testing to make sure the drug (medicine) you are taking is helping you and not harming you. All medicines have a therapeutic range. This is the amount of a medicine that is enough to help you but is not so much that it will harm you. Some medicines have a narrower range than other medicines.  Having too much of some types of medicine in your blood can be dangerous. If you are taking a medicine with a narrow therapeutic range, it is very important to take the right dose for you. Medicines that need TDM are usually medicines you need to take for long periods of time or for the rest of your life. Testing helps your health care provider know how the medicine is working over that time. Medicines that often require TDM include:  Heart medicine.  Some antibiotic medicines.  Antiseizure medicines (antiepileptics).  Medicine to treat certain lung conditions (bronchodilators).  Medicine to suppress your body's defense system (immunosuppressants).  Anticancer medicine.  Psychiatric medicine.  Why is therapeutic drug monitoring performed? Medicines work differently for each person depending on age and other health conditions. TDM helps your health care provider adjust your medicine so that you are getting the right amount. TDM can also help your health care provider make sure that medicine levels stay safe as your body changes, like if you become pregnant or sick with another illness. TDM is used to:  Make sure you have the right amount of medicine in your body.  Find out if the medicine is causing harm.  Make sure you are taking the medicine as your health care provider prescribed.  Find out if your health care provider needs to change your dose.  Find out if your health care provider needs to switch you to a different medicine.  How is therapeutic drug monitoring performed? TDM requires a blood sample. Your health care provider will let you know if there is specific day or time to give a blood sample. The timing of TDM is very important because the amount of the medicine in your blood changes over time. It goes up, peaks, and then starts to fall. It will be lowest right before it is time to take your next dose. How often do I need to have this test done? You may need to have TDM  soon after you begin taking a medicine. If your health care provider changes your dose, you might need to have one or two more TDM tests until you are in the therapeutic range. After that, you might need to have a follow-up test every few months. Each medicine that requires TDM has its own testing schedule. What if I forget to take my medicine before the test? Your health care provider will tell you when to take your medicine before the test. This is so the test measures the amount of medicine in your blood at the right time. If you forget or cannot take your medicine as instructed, contact your health care provider. You may need to reschedule the test. This information is not intended to replace advice given to you by your health care provider. Make sure you discuss any questions you have with your health care  provider. Document Released: 02/12/2014 Document Revised: 06/14/2016 Document Reviewed: 11/12/2013 Elsevier Interactive Patient Education  2017 [employer].

## 2018-01-31 LAB — COMPREHENSIVE METABOLIC PANEL
ALT: 26 IU/L (ref 0–32)
AST: 22 IU/L (ref 0–40)
Albumin/Globulin Ratio: 1.1 — ABNORMAL LOW (ref 1.2–2.2)
Albumin: 3.5 g/dL (ref 3.5–5.5)
Alkaline Phosphatase: 68 IU/L (ref 39–117)
BUN/Creatinine Ratio: 11 (ref 9–23)
BUN: 10 mg/dL (ref 6–24)
Bilirubin Total: <0.2 mg/dL (ref 0.0–1.2)
CO2: 25 mmol/L (ref 20–29)
Calcium: 9.4 mg/dL (ref 8.7–10.2)
Chloride: 102 mmol/L (ref 96–106)
Creatinine, Ser: 0.88 mg/dL (ref 0.57–1.00)
GFR calc Af Amer: 90 mL/min/{1.73_m2} (ref >59)
GFR calc non Af Amer: 78 mL/min/{1.73_m2} (ref >59)
Globulin, Total: 3.2 g/dL (ref 1.5–4.5)
Glucose: 84 mg/dL (ref 65–99)
Potassium: 4.3 mmol/L (ref 3.5–5.2)
Sodium: 141 mmol/L (ref 134–144)
Total Protein: 6.7 g/dL (ref 6.0–8.5)

## 2018-01-31 LAB — CBC WITH DIFFERENTIAL/PLATELET
Basophils Absolute: 0 10*3/uL (ref 0.0–0.2)
Basos: 0 %
EOS (ABSOLUTE): 0.1 10*3/uL (ref 0.0–0.4)
Eos: 1 %
Hematocrit: 36.9 % (ref 34.0–46.6)
Hemoglobin: 11.7 g/dL (ref 11.1–15.9)
Immature Grans (Abs): 0 10*3/uL (ref 0.0–0.1)
Immature Granulocytes: 0 %
Lymphocytes Absolute: 4.5 10*3/uL — ABNORMAL HIGH (ref 0.7–3.1)
Lymphs: 44 %
MCH: 26.2 pg — ABNORMAL LOW (ref 26.6–33.0)
MCHC: 31.7 g/dL (ref 31.5–35.7)
MCV: 83 fL (ref 79–97)
Monocytes Absolute: 0.3 10*3/uL (ref 0.1–0.9)
Monocytes: 3 %
Neutrophils Absolute: 5.5 10*3/uL (ref 1.4–7.0)
Neutrophils: 52 %
Platelets: 414 10*3/uL (ref 150–450)
RBC: 4.47 x10E6/uL (ref 3.77–5.28)
RDW: 16 % — ABNORMAL HIGH (ref 12.3–15.4)
WBC: 10.4 10*3/uL (ref 3.4–10.8)

## 2018-01-31 LAB — HEMOGLOBIN A1C
Est. average glucose Bld gHb Est-mCnc: 143 mg/dL
Hgb A1c MFr Bld: 6.6 % — ABNORMAL HIGH (ref 4.8–5.6)

## 2018-01-31 LAB — TSH: TSH: 0.123 u[IU]/mL — ABNORMAL LOW (ref 0.450–4.500)

## 2018-01-31 MED ORDER — ATENOLOL 25 MG PO TABS: 25.0000 mg | ORAL_TABLET | Freq: Every day | ORAL | 3 refills | Status: DC

## 2018-02-09 ENCOUNTER — Encounter: Payer: Self-pay | Admitting: Family Medicine

## 2018-02-09 DIAGNOSIS — E042 Nontoxic multinodular goiter: Secondary | ICD-10-CM

## 2018-02-09 DIAGNOSIS — R7989 Other specified abnormal findings of blood chemistry: Secondary | ICD-10-CM

## 2018-03-19 ENCOUNTER — Telehealth: Payer: Self-pay | Admitting: Family Medicine

## 2018-03-19 NOTE — Telephone Encounter (Signed)
Copied from [place] [id number]. Topic: Quick Communication - See Telephone Encounter >> Mar 19, 2018  4:25 PM Bea Graff, NT wrote: CRM for notification. See Telephone encounter for: 03/19/18. Colletta [place] with [employer] is wanting a call to see if the pt is able to use the brand name of the amphetamine-dextroamphetamine (ADDERALL XR) 20 MG 24 hr capsule. CB#: [phone]

## 2018-03-21 NOTE — Telephone Encounter (Signed)
Please advise. Dgaddy, CMA 

## 2018-03-22 ENCOUNTER — Telehealth: Payer: Self-pay | Admitting: Family Medicine

## 2018-03-22 NOTE — Telephone Encounter (Signed)
The patient was denied their medication (Amphetamine-Dextroamphet ER 20MG  er capsules).

## 2018-03-23 NOTE — Telephone Encounter (Signed)
Copied from [place]. Topic: General - Other >> Mar 23, 2018  3:51 PM Judyann Munson wrote: Reason for CRM:  patient is calling to request a nurse to give her call back in regards to her lab results from 01-30-18. She stated she seen her results on  my chart but needs help understanding. Please advise

## 2018-03-26 NOTE — Telephone Encounter (Signed)
Release Labs Thank You

## 2018-04-04 ENCOUNTER — Telehealth: Payer: Self-pay | Admitting: Family Medicine

## 2018-04-04 NOTE — Telephone Encounter (Signed)
Patient was denied their medication (Amphetamine-Dextroamphet) by their insurance company. Denial letter is in the providers box.

## 2018-04-12 ENCOUNTER — Telehealth: Payer: Self-pay | Admitting: Family Medicine

## 2018-04-12 NOTE — Telephone Encounter (Signed)
Called and left VM with pt advising them of Dr. Brigitte Pulse being on leave. I asked pt to call the office at her convenience and either make an appt with Dr. Brigitte Pulse in November or make an appt with a different provider if she needs medication refills before she could she Dr. Brigitte Pulse.   When pt calls back, please make an OV for: Return in about 3 months (around 05/02/2018) for med refills  Thank you!

## 2018-04-16 ENCOUNTER — Telehealth: Payer: Self-pay | Admitting: Family Medicine

## 2018-04-16 NOTE — Telephone Encounter (Signed)
Copied from [facility] [phone]. Topic: Quick Communication - See Telephone Encounter >> Apr 16, 2018 12:13 PM Percell Belt A wrote: CRM for notification. See Telephone encounter for: 04/16/18. Pt called in and was going to rec appt but she is not sure if she has enough meds to last to nov?  She is requesting a cma to call her back to talk about meds and what is the best option for the appt?   She would also like to talk about results from back in July.  Best number  [phone]

## 2018-04-23 NOTE — Telephone Encounter (Signed)
Please advise 

## 2018-04-30 ENCOUNTER — Other Ambulatory Visit: Payer: Self-pay | Admitting: Family Medicine

## 2018-04-30 DIAGNOSIS — F4323 Adjustment disorder with mixed anxiety and depressed mood: Secondary | ICD-10-CM

## 2018-04-30 NOTE — Telephone Encounter (Signed)
Clonazepam 0.5 mg tab  refill Last Refill:01/30/18 # 20  And one refill Last OV: 01/30/18 PCP: [place]. [place]: [facility] # [phone]  Controlled medication

## 2018-04-30 NOTE — Telephone Encounter (Signed)
Please advise on refill. Controlled.  

## 2018-05-01 NOTE — Telephone Encounter (Signed)
Sent to [employer] with note

## 2018-05-01 NOTE — Telephone Encounter (Signed)
Pt doesn't have an appt sced w/ me in Nov and things are very booked already with it being a holiday month. If she can get an appointment in November, and she is doing well, I will refill med x 1 mo to get her to visit.  However, if she can't get in until Dec or later, than rec that she instead see one of my colleagues this mo for med refills - highly recommend Dr. Nolon Rod or Pamella Pert

## 2018-05-01 NOTE — Telephone Encounter (Signed)
done

## 2018-05-01 NOTE — Telephone Encounter (Signed)
Whichever is fine - doesn't matter to me - not sure if pt has a preference though - check last note and if not mentioned than call pt

## 2018-05-02 NOTE — Telephone Encounter (Signed)
Pt has an appointment schedule and refill is pending.

## 2018-05-02 NOTE — Telephone Encounter (Signed)
[place] - Did you ever speak with pt? She still doesn't have an appt sched and now I am getting refill requests on her controlled meds. . . .  I am not able to fill the for the appropriate duration of time to get her to her next appointment if I don't know when her next appointment is and don't want to assume considering the factors of her schedule with work and child care and holidays and my sched likely being pretty full. If she scheds an appointment, I can put the appropriate # of refills on the rx rather than [place] having to process her refill requests without needing to this continue this monthly rigamarole.

## 2018-05-02 NOTE — Telephone Encounter (Signed)
I have talked to the Tracey Reed and she has scheduled an appointment for 06/11/2018 at 5:00 pm.

## 2018-05-02 NOTE — Telephone Encounter (Signed)
No future appts sched. Sent note to [place] asking her to contact pt to encourage pt to sched an appointment and explain why necessary - so we can provide appropriate # of refills on meds to get her to the appointment.  Today I have utilized the [place] Controlled Substance Registry's online query to confirm compliance regarding the patient's controlled medications. My review reveals appropriate prescription fills and that I am the sole provider of these medications. Rechecks will occur regularly and the patient is aware of our use of the system.  Prior prescription for clonazepam 0.5 mg #20 written 7/2 by myself was filled same day and 8/21.  Interestingly, the [place] CSRS DOES not show that she has filled any of the 3 Adderall XR 20 mg prescription that I wrote for her at our last office visit at her request which were to be filled on or after 7/2, 8/1, and 8/31.  These 3 scripts were all E-prescribed directly to her [facility] at [facility]. with receipt confirmed by pharmacy.  The database shows that her last Adderall ER 20 mg #30 tabs was written and filled on 4/30 by Madison Hickman at [address] in [place] and filled at the same location pharmacy named [facility] -I think this is the address of [facility]

## 2018-05-03 ENCOUNTER — Ambulatory Visit: Payer: Self-pay | Admitting: Family Medicine

## 2018-05-25 ENCOUNTER — Telehealth: Payer: Self-pay | Admitting: Family Medicine

## 2018-05-25 NOTE — Telephone Encounter (Signed)
Dr. Brigitte Pulse, This pt's Amphetamine-Dextroamphet ER 20MG  er capsules was denied on 03/19/18. There were some messages sent to you but may have gotten lost in your box? Please see note from [place] on 03/22/18.   Please advise. Pt has appt on 05/31/18.  Thank you!

## 2018-05-27 NOTE — Telephone Encounter (Signed)
This can be addressed at her appt 10/31.

## 2018-05-31 ENCOUNTER — Encounter: Payer: Self-pay | Admitting: Physician Assistant

## 2018-05-31 ENCOUNTER — Ambulatory Visit: Payer: BLUE CROSS/BLUE SHIELD | Admitting: Physician Assistant

## 2018-05-31 VITALS — BP 126/82 | HR 86 | Temp 98.3°F | Resp 16 | Ht 65.0 in | Wt 272.8 lb

## 2018-05-31 DIAGNOSIS — K580 Irritable bowel syndrome with diarrhea: Secondary | ICD-10-CM

## 2018-05-31 DIAGNOSIS — F411 Generalized anxiety disorder: Secondary | ICD-10-CM

## 2018-05-31 DIAGNOSIS — F4323 Adjustment disorder with mixed anxiety and depressed mood: Secondary | ICD-10-CM

## 2018-05-31 DIAGNOSIS — M62838 Other muscle spasm: Secondary | ICD-10-CM

## 2018-05-31 DIAGNOSIS — I10 Essential (primary) hypertension: Secondary | ICD-10-CM

## 2018-05-31 DIAGNOSIS — R7989 Other specified abnormal findings of blood chemistry: Secondary | ICD-10-CM

## 2018-05-31 DIAGNOSIS — Z23 Encounter for immunization: Secondary | ICD-10-CM | POA: Diagnosis not present

## 2018-05-31 DIAGNOSIS — Z9109 Other allergy status, other than to drugs and biological substances: Secondary | ICD-10-CM

## 2018-05-31 DIAGNOSIS — E1165 Type 2 diabetes mellitus with hyperglycemia: Secondary | ICD-10-CM | POA: Diagnosis not present

## 2018-05-31 LAB — POCT GLYCOSYLATED HEMOGLOBIN (HGB A1C): Hemoglobin A1C: 6.4 % — AB (ref 4.0–5.6)

## 2018-05-31 MED ORDER — LINACLOTIDE 290 MCG PO CAPS: 290.0000 ug | ORAL_CAPSULE | Freq: Every day | ORAL | 0 refills | Status: AC

## 2018-05-31 MED ORDER — CLONAZEPAM 0.5 MG PO TABS: 0.5000 mg | ORAL_TABLET | Freq: Two times a day (BID) | ORAL | 0 refills | Status: AC | PRN

## 2018-05-31 MED ORDER — METFORMIN HCL ER 500 MG PO TB24: 500.0000 mg | ORAL_TABLET | Freq: Every day | ORAL | 0 refills | Status: DC

## 2018-05-31 MED ORDER — IBUPROFEN 800 MG PO TABS: 800.0000 mg | ORAL_TABLET | Freq: Three times a day (TID) | ORAL | 1 refills | Status: AC | PRN

## 2018-05-31 MED ORDER — CITALOPRAM HYDROBROMIDE 20 MG PO TABS: 20.0000 mg | ORAL_TABLET | Freq: Every day | ORAL | 0 refills | Status: AC

## 2018-05-31 MED ORDER — HYDROXYZINE HCL 25 MG PO TABS: 25.0000 mg | ORAL_TABLET | ORAL | 0 refills | Status: AC | PRN

## 2018-05-31 MED ORDER — CYCLOBENZAPRINE HCL 10 MG PO TABS: 10.0000 mg | ORAL_TABLET | Freq: Three times a day (TID) | ORAL | 0 refills | Status: AC | PRN

## 2018-05-31 MED ORDER — ATENOLOL 25 MG PO TABS: 25.0000 mg | ORAL_TABLET | Freq: Every day | ORAL | 0 refills | Status: DC

## 2018-05-31 MED ORDER — TRIAMTERENE-HCTZ 37.5-25 MG PO TABS: 1.0000 | ORAL_TABLET | Freq: Every day | ORAL | 0 refills | Status: DC

## 2018-05-31 NOTE — Patient Instructions (Addendum)
We will contact you with your results. Only med change is increase celexa to 20mg  daily. Follow up in 3 months. Thank you for letting me participate in your health and well being.   If you have lab work done today you will be contacted with your lab results within the next 2 weeks.  If you have not heard from [place] then please contact us. The fastest way to get your results is to register for My Chart.   IF you received an x-ray today, you will receive an invoice from [facility]. Please contact [facility] at [phone] with questions or concerns regarding your invoice.   IF you received labwork today, you will receive an invoice from [facility]. Please contact [facility] at [phone] with questions or concerns regarding your invoice.   Our billing staff will not be able to assist you with questions regarding bills from these companies.  You will be contacted with the lab results as soon as they are available. The fastest way to get your results is to activate your My Chart account. Instructions are located on the last page of this paperwork. If you have not heard from [place] regarding the results in 2 weeks, please contact this office.

## 2018-05-31 NOTE — Progress Notes (Signed)
MRN: [id number]  Subjective:   Tracey Reed is a 47 y.o. female who presents for follow up of medication refill for chronic medical conditions. PCP is Dr.Shaw.    -T2DM:  Patient is currently managed with Continued metformin 59m daily which has been effective. Admits excellent compliance. Denies adverse effects including metallic taste, hypoglycemia, nausea, vomiting.. Patient is not checking home blood sugars. Home blood sugar records: patient does not check sugars. Current symptoms include none. Patient denies paresthesia of the feet, polydipsia, polyuria, visual disturbances, vomiting and weight loss. Patient is checking their feet daily. No foot concerns. Last diabetic eye exam eye exam: never. Diet: lots of baked stuff, drinks water and sweet tea. Goes to the pool daily.. Denies smoking or alcohol use. Known diabetic complications: noneImmunizations: Flu vaccine: 2018, pneumococal vaccine: never  -HTN :Currently managed with atenolol 255mand maxide 37.5-25 mg daily. Patient is not checking blood pressure at home.. Denies lightheadedness, dizziness, chronic headache, double vision, chest pain, shortness of breath, heart racing, palpitations, nausea, vomiting, abdominal pain, hematuria, lower leg swelling.   -Anxiety and depression:  Managed with Celexa 10 mg daily. Takes klonopin as needed. Typically needs it every day or every other day. Feels like she has had to take it more often recently. No therapy.  and not out of it. No alcohol or illicit drug use.   -Muscle spasms: uses flexeril as needed  -Chronic hives: uses hydroxyzine as needed  -Muscle pain: Takes ibuprofen as needed  Patient Active Problem List   Diagnosis Date Noted  . Vitamin D deficiency 11/26/2015  . Type 2 diabetes mellitus (HCRoxton04/27/2017  . Esophageal reflux 11/26/2015  . Environmental allergies 11/26/2015  . IBS (irritable bowel syndrome) 11/26/2015  . Adjustment disorder with mixed  anxiety and depressed mood 11/26/2015  . Attention-deficit hyperactivity disorder, predominantly hyperactive type 11/26/2015  . ADD (attention deficit disorder) 07/30/2015  . Multiple thyroid nodules 12/19/2014  . Essential hypertension 11/02/2012  . Anxiety state 11/02/2012  . Morbid obesity (HCHideaway04/10/2012   Social History   Socioeconomic History  . Marital status: Divorced    Spouse name: Not on file  . Number of children: Not on file  . Years of education: Not on file  . Highest education level: Not on file  Occupational History  . Not on file  Social Needs  . Financial resource strain: Not on file  . Food insecurity:    Worry: Not on file    Inability: Not on file  . Transportation needs:    Medical: Not on file    Non-medical: Not on file  Tobacco Use  . Smoking status: Never Smoker  . Smokeless tobacco: Never Used  Substance and Sexual Activity  . Alcohol use: Yes    Comment: occasional  . Drug use: No  . Sexual activity: Not on file  Lifestyle  . Physical activity:    Days per week: Not on file    Minutes per session: Not on file  . Stress: Not on file  Relationships  . Social connections:    Talks on phone: Not on file    Gets together: Not on file    Attends religious service: Not on file    Active member of club or organization: Not on file    Attends meetings of clubs or organizations: Not on file    Relationship status: Not on file  . Intimate partner violence:    Fear of current or ex partner:  Not on file    Emotionally abused: Not on file    Physically abused: Not on file    Forced sexual activity: Not on file  Other Topics Concern  . Not on file  Social History Narrative  . Not on file       Objective:   PHYSICAL EXAM BP 126/82 (BP Location: Right Arm, Patient Position: Sitting, Cuff Size: Large)   Pulse 86   Temp 98.3 F (36.8 C) (Oral)   Resp 16   Ht _0  (1.651 m)   Wt 272 lb 12.8 oz (123.7 kg)   SpO2 97%   BMI 45.40 kg/m     Physical Exam  Constitutional: She is oriented to person, place, and time. She appears well-developed and well-nourished. No distress.  HENT:  Head: Normocephalic and atraumatic.  Mouth/Throat: Uvula is midline, oropharynx is clear and moist and mucous membranes are normal.  Eyes: Pupils are equal, round, and reactive to light. Conjunctivae and EOM are normal.  Neck: Normal range of motion.  Cardiovascular: Normal rate, regular rhythm, normal heart sounds and intact distal pulses.  Pulmonary/Chest: Effort normal and breath sounds normal. She has no wheezes. She has no rhonchi. She has no rales.  Musculoskeletal:       Right lower leg: She exhibits no swelling.       Left lower leg: She exhibits no swelling.  Neurological: She is alert and oriented to person, place, and time.  Skin: Skin is warm and dry.  Psychiatric: She has a normal mood and affect.  Vitals reviewed.    No results found for this or any previous visit (from the past 24 hour(s)). GAD 7 : Generalized Anxiety Score 05/31/2018  Nervous, Anxious, on Edge 1  Control/stop worrying 1  Worry too much - different things 1  Trouble relaxing 1  Restless 1  Easily annoyed or irritable 3  Afraid - awful might happen 0  Total GAD 7 Score 8   Results for orders placed or performed in visit on 05/31/18 (from the past 24 hour(s))  POCT glycosylated hemoglobin (Hb A1C)     Status: Abnormal   Collection Time: 05/31/18  3:48 PM  Result Value Ref Range   Hemoglobin A1C 6.4 (A) 4.0 - 5.6 %   HbA1c POC (<> result, manual entry)     HbA1c, POC (prediabetic range)     HbA1c, POC (controlled diabetic range)       Assessment and Plan :  1. Type 2 diabetes mellitus with hyperglycemia, without long-term current use of insulin (HCC) Controlled. Cont current med regimen. - Lipid panel - POCT glycosylated hemoglobin (Hb A1C) - Microalbumin/Creatinine Ratio, Urine - metFORMIN (GLUCOPHAGE-XR) 500 MG 24 hr tablet; Take 1 tablet (500  mg total) by mouth daily with breakfast.  Dispense: 90 tablet; Refill: 0  2. Essential hypertension Controlled. Continue med regimen.  - CBC with Differential/Platelet - CMP14+EGFR - triamterene-hydrochlorothiazide (MAXZIDE-25) 37.5-25 MG tablet; Take 1 tablet by mouth daily.  Dispense: 90 tablet; Refill: 0  3. Adjustment disorder with mixed anxiety and depressed mood Believe pt would benefit from increase in celexa dose, as she has been having to use klonopin more frequently due to anxiety. Increase to celexa 74m daily.  [place] and is consistent with patient's history. - clonazePAM (KLONOPIN) 0.5 MG tablet; Take 1 tablet (0.5 mg total) by mouth 2 (two) times daily as needed.  Dispense: 60 tablet; Refill: 0 - citalopram (CELEXA) 20 MG tablet; Take  1 tablet (20 mg total) by mouth daily.  Dispense: 90 tablet; Refill: 0  4. Anxiety state  5. Low TSH level - TSH - T4, Free  6. Environmental allergies - hydrOXYzine (ATARAX/VISTARIL) 25 MG tablet; Take 1-2 tablets (25-50 mg total) by mouth every 4 (four) hours as needed for itching (urticaria). For itching and hives  Dispense: 90 tablet; Refill: 0  7. Muscle spasm - cyclobenzaprine (FLEXERIL) 10 MG tablet; Take 1 tablet (10 mg total) by mouth 3 (three) times daily as needed.  Dispense: 60 tablet; Refill: 0 - ibuprofen (ADVIL,MOTRIN) 800 MG tablet; Take 1 tablet (800 mg total) by mouth every 8 (eight) hours as needed.  Dispense: 60 tablet; Refill: 1 - Pneumococcal polysaccharide vaccine 23-valent greater than or equal to 2yo subcutaneous/IM  8. Irritable bowel syndrome with diarrhea - linaclotide (LINZESS) 290 MCG CAPS capsule; Take 1 capsule (290 mcg total) by mouth daily before breakfast.  Dispense: 90 capsule; Refill: 0  9. Needs flu shot - Flu Vaccine QUAD 36+ mos IM  Pt's insurance is running out soon as she is taking a new job.Requested at least 60 days for all medications  so they are covered. Given pt 90 day supply of all requested meds. F/u in 3 months for reevaluation.   A total of 40 minutes was spent in the room with the patient, greater than 50% of which was in counseling/coordination of care regarding above.   Tenna Delaine, PA-C  Primary [facility] at [facility] 05/31/2018 3:21 PM

## 2018-06-01 LAB — CBC WITH DIFFERENTIAL/PLATELET
Basophils Absolute: 0 10*3/uL (ref 0.0–0.2)
Basos: 0 %
EOS (ABSOLUTE): 0.1 10*3/uL (ref 0.0–0.4)
Eos: 1 %
Hematocrit: 38.7 % (ref 34.0–46.6)
Hemoglobin: 13 g/dL (ref 11.1–15.9)
Immature Grans (Abs): 0 10*3/uL (ref 0.0–0.1)
Immature Granulocytes: 0 %
Lymphocytes Absolute: 3.9 10*3/uL — ABNORMAL HIGH (ref 0.7–3.1)
Lymphs: 42 %
MCH: 26.4 pg — ABNORMAL LOW (ref 26.6–33.0)
MCHC: 33.6 g/dL (ref 31.5–35.7)
MCV: 79 fL (ref 79–97)
Monocytes Absolute: 0.5 10*3/uL (ref 0.1–0.9)
Monocytes: 5 %
Neutrophils Absolute: 4.8 10*3/uL (ref 1.4–7.0)
Neutrophils: 52 %
Platelets: 432 10*3/uL (ref 150–450)
RBC: 4.93 x10E6/uL (ref 3.77–5.28)
RDW: 15.6 % — ABNORMAL HIGH (ref 12.3–15.4)
WBC: 9.3 10*3/uL (ref 3.4–10.8)

## 2018-06-01 LAB — MICROALBUMIN / CREATININE URINE RATIO
Creatinine, Urine: 88.8 mg/dL
Microalb/Creat Ratio: 19.6 mg/g creat (ref 0.0–30.0)
Microalbumin, Urine: 17.4 ug/mL

## 2018-06-01 LAB — CMP14+EGFR
ALT: 14 IU/L (ref 0–32)
AST: 17 IU/L (ref 0–40)
Albumin/Globulin Ratio: 1.1 — ABNORMAL LOW (ref 1.2–2.2)
Albumin: 3.9 g/dL (ref 3.5–5.5)
Alkaline Phosphatase: 71 IU/L (ref 39–117)
BUN/Creatinine Ratio: 12 (ref 9–23)
BUN: 13 mg/dL (ref 6–24)
Bilirubin Total: 0.2 mg/dL (ref 0.0–1.2)
CO2: 20 mmol/L (ref 20–29)
Calcium: 9.6 mg/dL (ref 8.7–10.2)
Chloride: 102 mmol/L (ref 96–106)
Creatinine, Ser: 1.06 mg/dL — ABNORMAL HIGH (ref 0.57–1.00)
GFR calc Af Amer: 72 mL/min/{1.73_m2} (ref >59)
GFR calc non Af Amer: 63 mL/min/{1.73_m2} (ref >59)
Globulin, Total: 3.6 g/dL (ref 1.5–4.5)
Glucose: 106 mg/dL — ABNORMAL HIGH (ref 65–99)
Potassium: 4.3 mmol/L (ref 3.5–5.2)
Sodium: 140 mmol/L (ref 134–144)
Total Protein: 7.5 g/dL (ref 6.0–8.5)

## 2018-06-01 LAB — LIPID PANEL
Chol/HDL Ratio: 4.1 ratio (ref 0.0–4.4)
Cholesterol, Total: 236 mg/dL — ABNORMAL HIGH (ref 100–199)
HDL: 57 mg/dL (ref >39)
LDL Calculated: 160 mg/dL — ABNORMAL HIGH (ref 0–99)
Triglycerides: 96 mg/dL (ref 0–149)
VLDL Cholesterol Cal: 19 mg/dL (ref 5–40)

## 2018-06-01 LAB — T4, FREE: Free T4: 1.24 ng/dL (ref 0.82–1.77)

## 2018-06-01 LAB — TSH: TSH: 0.33 u[IU]/mL — ABNORMAL LOW (ref 0.450–4.500)

## 2018-06-11 ENCOUNTER — Ambulatory Visit: Payer: Self-pay | Admitting: Family Medicine

## 2018-08-10 ENCOUNTER — Telehealth: Payer: Self-pay | Admitting: Family Medicine

## 2018-08-10 NOTE — Telephone Encounter (Signed)
This is a former pt of Dr. Everlene Farrier, Dr. Pamella Pert now covers [place] who use to cover [place]. Pt filled out a release that I completed. She wanted me to mail it to her.  I did on around 05/28/18. I mailed to the address she gave me it was sent back. I have called her multiple times for her to pick up this info. There is no dpr, so I can't leave a message. She will have to call back to get this info.

## 2018-09-14 ENCOUNTER — Ambulatory Visit: Payer: BLUE CROSS/BLUE SHIELD | Admitting: Internal Medicine

## 2018-09-14 ENCOUNTER — Encounter: Payer: Self-pay | Admitting: Internal Medicine

## 2018-09-14 VITALS — BP 126/80 | HR 86 | Ht 65.0 in | Wt 274.0 lb

## 2018-09-14 DIAGNOSIS — E042 Nontoxic multinodular goiter: Secondary | ICD-10-CM

## 2018-09-14 DIAGNOSIS — E0590 Thyrotoxicosis, unspecified without thyrotoxic crisis or storm: Secondary | ICD-10-CM

## 2018-09-14 LAB — T3, FREE: T3, Free: 3.1 pg/mL (ref 2.3–4.2)

## 2018-09-14 LAB — TSH: TSH: 0.5 u[IU]/mL (ref 0.35–4.50)

## 2018-09-14 LAB — T4, FREE: Free T4: 0.99 ng/dL (ref 0.60–1.60)

## 2018-09-14 NOTE — Progress Notes (Signed)
Patient ID: Tracey Reed, female   DOB: Mar 19, 1971, 48 y.o.   MRN: [id number]   HPI  Tracey Reed is a 48 y.o.-year-old female, returning for follow-up for a low TSH and thyroid nodules with neck compression sxs. Last visit almost 3 years ago.  She has been feeling more tired in last year >> drinks caffeine.  She is participating in [employer] lost 5 pounds recently.  Suppressed TSH:  She had 2 suppressed TSH levels in 2016.  After a TSH returned low at 0.051, we checked a thyroid uptake and scan on 05/14/2015, and this showed a normal uptake of 11.8%  (10-30%) we discount consistent with multinodular gland.  The low TSH levels at that time were attributed to taking biotin.  However, she had a suppressed TSH again in 08/2017 and another TSH improved, but still suppressed in 05/2018. She does not remember if she took Biotin at that time.  She is currently taking a multivitamin with biotin daily.  She took it this morning.  Reviewed patient's TFTs: Lab Results  Component Value Date   TSH 0.330 (L) 05/31/2018   TSH 0.123 (L) 01/30/2018   TSH 0.574 08/08/2016   TSH 0.71 11/27/2015   TSH 0.65 05/29/2015   TSH 0.051 (L) 04/26/2015   TSH 0.358 12/18/2014   TSH 0.109 (L) 10/02/2014   TSH 0.716 11/02/2012   TSH 0.606 11/01/2011   FREET4 1.24 05/31/2018   FREET4 0.91 11/27/2015   FREET4 0.88 05/29/2015   FREET4 0.98 12/18/2014    Lab Results  Component Value Date   T3FREE 3.7 11/27/2015   T3FREE 3.3 05/29/2015   T3FREE 3.1 12/18/2014   She has several thyroid nodules:  Thyroid U/S (03/12/2014) - thyroid not enlarged, largest nodules 1.1 cm.  Barium swallow study showed minimal external indentation on the esophagus possibly from the thyroid.  Pt denies: - feeling nodules in neck - hoarseness - dysphagia - choking - SOB with lying down She had hoarseness and some dysphagia at last visit, improved with Zyrtec. Now off Zyrtec and she restarted  to have the same symptoms.  They are less frequent, though.  At last visit, she was having some excessive sweating (hot flushes - less frequent anymore) and heat intolerance (she still has this but mostly hot flashes) and also constipation (IBS).    Pt does have a FH of thyroid ds: niece. No FH of thyroid cancer. No h/o radiation tx to head or neck.  No seaweed or kelp. No recent contrast studies. No herbal supplements. No Biotin use. No recent steroids use.   I reviewed her chart and she also has a history of IBS, HTN, HL, vit D def, anxiety, depression (seasonal).  On Adderall, Celexa.  ROS: Constitutional: + Weight gain and weight loss, + fatigue, + subjective hyperthermia and hot flashes, no subjective hypothermia Eyes: no blurry vision, no xerophthalmia ENT: + Sore throat, + see HPI Cardiovascular: no CP/no SOB/no palpitations/+ leg swelling Respiratory: no cough/no SOB/no wheezing Gastrointestinal: no N/no V/no D/+ C/no acid reflux Musculoskeletal: no muscle aches/+ joint aches Skin: no rashes, no hair loss Neurological: no tremors/no numbness/no tingling/no dizziness, + headaches  I reviewed pt's medications, allergies, PMH, social hx, family hx, and changes were documented in the history of present illness. Otherwise, unchanged from my initial visit note.  Past Medical History:  Diagnosis Date  . Anxiety   . Diabetes mellitus without complication ([place])   . Environmental and seasonal allergies   . Hypertension  Past Surgical History:  Procedure Laterality Date  . ENDOMETRIAL ABLATION    . TUBAL LIGATION     tubal ligation  History   Social History  . Marital Status: Married    Spouse Name: N/A  . Number of Children: 1   Occupational History  .  CMA at [employer] .   Social History Main Topics  . Smoking status: Never Smoker   . Smokeless tobacco: Not on file  . Alcohol Use: Yes, socially, mixed drinks, 1-2 drinks every 2 months   . Drug Use: No    Current Outpatient Medications on File Prior to Visit  Medication Sig Dispense Refill  . amphetamine-dextroamphetamine (ADDERALL XR) 20 MG 24 hr capsule Take 1 capsule (20 mg total) by mouth every morning. 30 capsule 0  . amphetamine-dextroamphetamine (ADDERALL XR) 20 MG 24 hr capsule Take 1 capsule (20 mg total) by mouth every morning. 30 capsule 0  . amphetamine-dextroamphetamine (ADDERALL XR) 20 MG 24 hr capsule Take 1 capsule (20 mg total) by mouth every morning. 30 capsule 0  . atenolol (TENORMIN) 25 MG tablet Take 1 tablet (25 mg total) by mouth daily. 90 tablet 0  . blood glucose meter kit and supplies KIT Dispense based on patient and insurance preference. Use up to four times daily as directed. E11.65. Increased testing frequency hyperglycemia (Patient not taking: Reported on 05/31/2018) 1 each 0  . citalopram (CELEXA) 20 MG tablet Take 1 tablet (20 mg total) by mouth daily. 90 tablet 0  . clonazePAM (KLONOPIN) 0.5 MG tablet Take 1 tablet (0.5 mg total) by mouth 2 (two) times daily as needed. 60 tablet 0  . cyclobenzaprine (FLEXERIL) 10 MG tablet Take 1 tablet (10 mg total) by mouth 3 (three) times daily as needed. 60 tablet 0  . glucose blood test strip Use as instructed UP TO 4 TIMES A DAY. Dx: Increased testing frequency hyperglycemia, HTN 100 each 3  . hydrOXYzine (ATARAX/VISTARIL) 25 MG tablet Take 1-2 tablets (25-50 mg total) by mouth every 4 (four) hours as needed for itching (urticaria). For itching and hives 90 tablet 0  . ibuprofen (ADVIL,MOTRIN) 800 MG tablet Take 1 tablet (800 mg total) by mouth every 8 (eight) hours as needed. 60 tablet 1  . levocetirizine (XYZAL) 5 MG tablet Take 1 tablet (5 mg total) by mouth every evening. 90 tablet 1  . linaclotide (LINZESS) 290 MCG CAPS capsule Take 1 capsule (290 mcg total) by mouth daily before breakfast. 90 capsule 0  . metFORMIN (GLUCOPHAGE-XR) 500 MG 24 hr tablet Take 1 tablet (500 mg total) by mouth daily with breakfast. 90 tablet  0  . montelukast (SINGULAIR) 10 MG tablet Take 1 tablet (10 mg total) by mouth at bedtime. 90 tablet 3  . triamterene-hydrochlorothiazide (MAXZIDE-25) 37.5-25 MG tablet Take 1 tablet by mouth daily. 90 tablet 0  . TRUEPLUS LANCETS 30G MISC USE 4 TIMES DAILY 100 each 3   No current facility-administered medications on file prior to visit.    Allergies  Allergen Reactions  . Clindamycin/Lincomycin Rash   Family History  Problem Relation Age of Onset  . Hypertension Mother   . Cancer Maternal Grandmother   . Thyroid disease Neg Hx    + hypertension in mother, grandmother, aunts  PE: BP 126/80   Pulse 86   Ht 5' 5"  (1.651 m) Comment: measured  Wt 274 lb (124.3 kg)   SpO2 98%   BMI 45.60 kg/m  Body mass index is 45.6 kg/m. Wt Readings from  Last 3 Encounters:  09/14/18 274 lb (124.3 kg)  05/31/18 272 lb 12.8 oz (123.7 kg)  01/30/18 279 lb 9.6 oz (126.8 kg)   Constitutional: overweight, in NAD Eyes: PERRLA, EOMI, no exophthalmos ENT: moist mucous membranes, + L lobe fulness, no cervical lymphadenopathy Cardiovascular: RRR, No MRG Respiratory: CTA B Gastrointestinal: abdomen soft, NT, ND, BS+ Musculoskeletal: no deformities, strength intact in all 4 Skin: moist, warm, no rashes Neurological: no tremor with outstretched hands, DTR normal in all 4  ASSESSMENT: 1.  Subclinical thyrotoxicosis  2. Multiple thyroid nodules - Thyroid U/S (12/24/2014): Right thyroid lobe: 4.2 x 1.3 x 1.7 cm. Diffusely heterogeneous thyroid parenchyma. There are multiple small hypoechoic solid nodules centrally within the gland measuring less than 10 mm. There is a single ovoid hypoechoic solid nodule in the medial mid gland which measures 11 x 5 x 9 mm.  Left thyroid lobe: 4 x 1 x 2 cm. Diffusely heterogeneous thyroid parenchyma. At least 3 small hypoechoic solid nodules are present within the mid to lower gland measuring 5 and 6 mm in diameter.  Isthmus Thickness: 0.6 cm. No nodules  visualized.  Lymphadenopathy None visualized.  IMPRESSION: 1. Diffusely heterogeneous thyroid parenchyma as can be seen in the setting of thyroiditis. 2. Small bilateral thyroid nodules measuring up to 1.1 cm on the right.   - Ba swallow (12/25/2014): Initially double-contrast barium swallow was performed. The mucosa of the esophagus is unremarkable. A single contrast study shows the swallowing mechanism to be normal. There is very minimal indentation of the left lower cervical esophagus which may be due to thyroid nodules. Esophageal peristalsis is normal. No hiatal hernia is seen. There is moderate gastroesophageal reflux demonstrated. A barium pill was given at the end of the study which passed into the stomach without delay.  IMPRESSION: 1. Moderate gastroesophageal reflux. Barium pill passes into the stomach without delay. 2. Minimal indentation upon the lower cervical esophagus may be due to previously demonstrated thyroid nodules.  - Thyroid uptake and scan (05/15/2015): Mildly heterogeneous uptake within thyroid gland which is mildly enlarged. No measurable cold nodules are present.  24 hour I 131 uptake = 11.8% (normal 10-30%)  IMPRESSION: 1. Mild heterogeneity of uptake consistent with multinodular gland. 2. Normal 24 hour I 131 uptake.   PLAN:  1. Patient with with a history of low TSH in 2016, but without thyrotoxic symptoms except for heat intolerance.  At that time a thyroid uptake appeared normal but there was heterogeneity in the scan, consistent with multinodular gland.  As she was taking biotin at that time, I attributed the low TSH to the interference of biotin with a thyroid test assays. -However, she was recently found to have another low TSH along with normal free T4 -last measurement showed an improved TSH, but still low, on 05/31/2018 -At this visit, we will repeat her TFTs -I am wondering whether we need to start a low-dose methimazole if her TSH  remains low, but I doubt this because she does not appear to have signs or symptoms of thyrotoxicosis now except for hot flashes, which could be related to perimenopause -I will see her back in a year, but return in 6 months for labs.  We may need to repeat her labs sooner, depending on the results today.  2. Thyroid nodules -I reviewed the reports of her thyroid ultrasound, barium swallow, and also uptake and scan along with the patient.  The images of her thyroid ultrasound obtained at [facility] are not  available.  However, per the report, she had several small hypoechoic nodules bilaterally, with the largest one measuring 1.1 cm.  The nodules did not appear to be cold on the uptake and scan.  The actual size of the thyroid was normal, not enlarged.  At last visit, almost 3 years ago, we discussed that it was very unlikely that the thyroid was causing her compression symptoms.  Her symptoms at that time improved significantly after addition of Zyrtec so her dysphagia appeared to have an allergic component.  Now, of Zyrtec, she occasionally has some hoarseness and choking.  She is planning to restart Zyrtec.  - time spent with the patient: 15 minutes, of which >50% was spent in obtaining information about her symptoms, reviewing her previous labs, evaluations, and treatments, counseling her about her condition (please see the discussed topics above), and developing a plan to further investigate and treat it; she had a number of questions which I addressed.  Office Visit on 09/14/2018  Component Date Value Ref Range Status  . TSH 09/14/2018 0.50  0.35 - 4.50 uIU/mL Final  . Free T4 09/14/2018 0.99  0.60 - 1.60 ng/dL Final   Comment: Specimens from patients who are undergoing biotin therapy and /or ingesting biotin supplements may contain high levels of biotin.  The higher biotin concentration in these specimens interferes with this Free T4 assay.  Specimens that contain high levels  of  biotin may cause false high results for this Free T4 assay.  Please interpret results in light of the total clinical presentation of the patient.    . T3, Free 09/14/2018 3.1  2.3 - 4.2 pg/mL Final   TFTs are now normal.  Philemon Kingdom, MD PhD [facility] Endocrinology

## 2018-09-14 NOTE — Patient Instructions (Signed)
Please stop at the lab.  Please come back for a follow-up appointment in 1 year, but for labs in 6 months.

## 2018-12-02 ENCOUNTER — Other Ambulatory Visit: Payer: Self-pay | Admitting: Physician Assistant

## 2018-12-02 DIAGNOSIS — I10 Essential (primary) hypertension: Secondary | ICD-10-CM

## 2018-12-03 ENCOUNTER — Other Ambulatory Visit: Payer: Self-pay | Admitting: Registered Nurse

## 2018-12-03 ENCOUNTER — Telehealth: Payer: Self-pay | Admitting: Family Medicine

## 2018-12-03 DIAGNOSIS — I10 Essential (primary) hypertension: Secondary | ICD-10-CM

## 2018-12-03 MED ORDER — TRIAMTERENE-HCTZ 37.5-25 MG PO TABS: 1.0000 | ORAL_TABLET | Freq: Every day | ORAL | 0 refills | Status: DC

## 2018-12-03 MED ORDER — TRIAMTERENE-HCTZ 37.5-25 MG PO TABS: 1.0000 | ORAL_TABLET | Freq: Every day | ORAL | 0 refills | Status: AC

## 2018-12-03 NOTE — Telephone Encounter (Signed)
Patient does not have any medication , could not get her a TOC until May 8th  Patient wants to know if she can get a courtesy refill at the [facility] in [place] she is with her parents   Vladimir Faster [address] [place], [place]  [address]  Pharmacy she would like meds to go [phone]

## 2018-12-03 NOTE — Progress Notes (Signed)
Pt given courtesy refills of triamterezene-HCTZ before appt on Friday for TOC.

## 2018-12-03 NOTE — Telephone Encounter (Signed)
Pt will need to discuss these medication with the provider at her appointment due do some of the medication are control medication and she will be Est. Care with a new PCP

## 2018-12-07 ENCOUNTER — Telehealth: Payer: BLUE CROSS/BLUE SHIELD | Admitting: Registered Nurse

## 2018-12-07 ENCOUNTER — Telehealth: Payer: Self-pay | Admitting: Family Medicine

## 2018-12-07 ENCOUNTER — Telehealth: Payer: Self-pay | Admitting: Registered Nurse

## 2018-12-07 ENCOUNTER — Other Ambulatory Visit: Payer: Self-pay

## 2018-12-07 NOTE — Telephone Encounter (Signed)
Called pt LVM for her to call and verify ins [employer] local we cano not see the patient and appt wil need to be cancelled FR

## 2018-12-07 NOTE — Telephone Encounter (Signed)
She said she doesn't need the metformin.

## 2018-12-07 NOTE — Telephone Encounter (Signed)
Pt is wanting a refill on triamterene-hydrochlorothiazide (MAXZIDE-25) 37.5-25 MG tablet [[id number]]   And Metphotmin  ,  FR pt had  TOC with [facility]ard but pt has [employer] local...  FR  Pt will call back with others

## 2018-12-13 NOTE — Telephone Encounter (Signed)
Courtesy refill given #10 tabs of triamterene-hctz given on 12/03/2018 and pt cancelled 12/07/2018 appt so no further refills can be given w/o scheduled and kept appt. FYI

## 2019-03-15 ENCOUNTER — Other Ambulatory Visit: Payer: Self-pay

## 2019-04-16 ENCOUNTER — Other Ambulatory Visit: Payer: Self-pay | Admitting: Registered Nurse

## 2019-04-16 DIAGNOSIS — I10 Essential (primary) hypertension: Secondary | ICD-10-CM

## 2019-04-16 NOTE — Telephone Encounter (Signed)
Requested medication (s) are due for refill today: yes  Requested medication (s) are on the active medication list: yes  Last refill: 12/03/2018  Future visit scheduled: no  Notes to clinic: review for refill   Requested Prescriptions  Pending Prescriptions Disp Refills   triamterene-hydrochlorothiazide (MAXZIDE-25) 37.5-25 MG tablet [Pharmacy Med Name: Triamterene-HCTZ 37.5-25 MG Oral Tablet] 10 tablet 0    Sig: Take 1 tablet by mouth once daily     Cardiovascular: Diuretic Combos Failed - 04/16/2019  9:11 AM      Failed - Cr in normal range and within 360 days    Creat  Date Value Ref Range Status  04/26/2015 0.86 0.50 - 1.10 mg/dL Final   Creatinine, Ser  Date Value Ref Range Status  05/31/2018 1.06 (H) 0.57 - 1.00 mg/dL Final         Failed - Valid encounter within last 6 months    Recent Outpatient Visits          10 months ago Type 2 diabetes mellitus with hyperglycemia, without long-term current use of insulin ([place])   [facility] at [place], [place] D, PA-C   1 year ago Type 2 diabetes mellitus with hyperglycemia, without long-term current use of insulin [facility])   [facility], Laurey Arrow, MD   2 years ago Type 2 diabetes mellitus without complication, without long-term current use of insulin [facility])   [facility], Laurey Arrow, MD   3 years ago Vitamin D deficiency   [facility], Synetta Shadow, MD   3 years ago ADD (attention deficit disorder) without hyperactivity   [facility], Janalee Dane, MD             Passed - K in normal range and within 360 days    Potassium  Date Value Ref Range Status  05/31/2018 4.3 3.5 - 5.2 mmol/L Final         Passed - Na in normal range and within 360 days    Sodium  Date Value Ref Range Status  05/31/2018 140 134 - 144 mmol/L Final         Passed - Ca in normal range and within 360 days    Calcium  Date Value Ref Range Status  05/31/2018 9.6 8.7 - 10.2 mg/dL Final          Passed - Last BP in normal range    BP Readings from Last 1 Encounters:  12/15/15 125/77

## 2019-09-16 ENCOUNTER — Ambulatory Visit: Payer: Medicaid Other | Admitting: Internal Medicine

## 2019-10-25 ENCOUNTER — Ambulatory Visit: Payer: BLUE CROSS/BLUE SHIELD | Attending: Internal Medicine

## 2019-10-25 DIAGNOSIS — Z23 Encounter for immunization: Secondary | ICD-10-CM

## 2019-10-25 NOTE — Progress Notes (Signed)
   Covid-19 Vaccination Clinic  Name:  Tracey Reed    MRN: [id number] DOB: 04-05-1971  10/25/2019  [name] was observed post Covid-19 immunization for 15 minutes without incident. She was provided with Vaccine Information Sheet and instruction to access the V-Safe system.   [name] was instructed to call 911 with any severe reactions post vaccine: Marland Kitchen Difficulty breathing  . Swelling of face and throat  . A fast heartbeat  . A bad rash all over body  . Dizziness and weakness   Immunizations Administered    Name Date Dose VIS Date Route   Pfizer COVID-19 Vaccine 10/25/2019 12:19 PM 0.3 mL 07/12/2019 Intramuscular   Manufacturer: [place]   Lot: [id number]   [place]: [id number]

## 2019-11-19 ENCOUNTER — Ambulatory Visit: Payer: Self-pay

## 2019-11-26 ENCOUNTER — Ambulatory Visit: Payer: BLUE CROSS/BLUE SHIELD | Attending: Internal Medicine

## 2019-11-26 DIAGNOSIS — Z23 Encounter for immunization: Secondary | ICD-10-CM

## 2019-11-26 NOTE — Progress Notes (Signed)
   Covid-19 Vaccination Clinic  Name:  Tracey Reed    MRN: [id number] DOB: Apr 27, 1971  11/26/2019  Tracey Reed was observed post Covid-19 immunization for 15 minutes without incident. She was provided with Vaccine Information Sheet and instruction to access the V-Safe system.   Tracey Reed was instructed to call 911 with any severe reactions post vaccine: Marland Kitchen Difficulty breathing  . Swelling of face and throat  . A fast heartbeat  . A bad rash all over body  . Dizziness and weakness   Immunizations Administered    Name Date Dose VIS Date Route   Pfizer COVID-19 Vaccine 11/26/2019 11:37 AM 0.3 mL 09/25/2018 Intramuscular   Manufacturer: [place]   Lot: [id number]   [place]: [id number]

## 2021-03-14 ENCOUNTER — Ambulatory Visit: Payer: BLUE CROSS/BLUE SHIELD

## 2021-03-14 ENCOUNTER — Telehealth: Payer: Self-pay | Admitting: Emergency Medicine

## 2021-03-14 NOTE — Telephone Encounter (Signed)
Call to pt regarding upcoming visit today - confirmed 2 identifiers. Chart history reviewed w/ provider here today. Pt states she has had an injury in the past to the same knee, no recent injury but pt has warmth and swelling to knee. Pt is diabetic. Due to possibility of an effusion w/ infection, pt will go to [facility] in [place] where she resides today - open from [phone]. Pt thanked me for the call.

## 2021-08-17 LAB — EXTERNAL GENERIC LAB PROCEDURE: COLOGUARD: NEGATIVE

## 2022-11-29 ENCOUNTER — Emergency Department (HOSPITAL_BASED_OUTPATIENT_CLINIC_OR_DEPARTMENT_OTHER): Payer: PRIVATE HEALTH INSURANCE | Admitting: Radiology

## 2022-11-29 ENCOUNTER — Encounter (HOSPITAL_BASED_OUTPATIENT_CLINIC_OR_DEPARTMENT_OTHER): Payer: Self-pay

## 2022-11-29 ENCOUNTER — Other Ambulatory Visit: Payer: Self-pay

## 2022-11-29 ENCOUNTER — Emergency Department (HOSPITAL_BASED_OUTPATIENT_CLINIC_OR_DEPARTMENT_OTHER)
Admission: EM | Admit: 2022-11-29 | Discharge: 2022-11-29 | Disposition: A | Discharge status: Home / Self Care | Payer: PRIVATE HEALTH INSURANCE | Attending: Emergency Medicine | Admitting: Emergency Medicine

## 2022-11-29 DIAGNOSIS — Z79899 Other long term (current) drug therapy: Secondary | ICD-10-CM | POA: Diagnosis not present

## 2022-11-29 DIAGNOSIS — I10 Essential (primary) hypertension: Secondary | ICD-10-CM | POA: Insufficient documentation

## 2022-11-29 DIAGNOSIS — Z7984 Long term (current) use of oral hypoglycemic drugs: Secondary | ICD-10-CM | POA: Diagnosis not present

## 2022-11-29 DIAGNOSIS — E119 Type 2 diabetes mellitus without complications: Secondary | ICD-10-CM | POA: Diagnosis not present

## 2022-11-29 DIAGNOSIS — R002 Palpitations: Secondary | ICD-10-CM | POA: Diagnosis present

## 2022-11-29 DIAGNOSIS — I493 Ventricular premature depolarization: Secondary | ICD-10-CM | POA: Insufficient documentation

## 2022-11-29 LAB — CBC WITH DIFFERENTIAL/PLATELET
Abs Immature Granulocytes: 0.02 10*3/uL (ref 0.00–0.07)
Basophils Absolute: 0.1 10*3/uL (ref 0.0–0.1)
Basophils Relative: 1 %
Eosinophils Absolute: 0.2 10*3/uL (ref 0.0–0.5)
Eosinophils Relative: 2 %
HCT: 36 % (ref 36.0–46.0)
Hemoglobin: 11.7 g/dL — ABNORMAL LOW (ref 12.0–15.0)
Immature Granulocytes: 0 %
Lymphocytes Relative: 44 %
Lymphs Abs: 4.8 10*3/uL — ABNORMAL HIGH (ref 0.7–4.0)
MCH: 26.1 pg (ref 26.0–34.0)
MCHC: 32.5 g/dL (ref 30.0–36.0)
MCV: 80.4 fL (ref 80.0–100.0)
Monocytes Absolute: 0.5 10*3/uL (ref 0.1–1.0)
Monocytes Relative: 5 %
Neutro Abs: 5.3 10*3/uL (ref 1.7–7.7)
Neutrophils Relative %: 48 %
Platelets: 370 10*3/uL (ref 150–400)
RBC: 4.48 MIL/uL (ref 3.87–5.11)
RDW: 16.6 % — ABNORMAL HIGH (ref 11.5–15.5)
WBC: 10.9 10*3/uL — ABNORMAL HIGH (ref 4.0–10.5)
nRBC: 0 % (ref 0.0–0.2)

## 2022-11-29 LAB — BASIC METABOLIC PANEL
Anion gap: 8 (ref 5–15)
BUN: 17 mg/dL (ref 6–20)
CO2: 30 mmol/L (ref 22–32)
Calcium: 9.1 mg/dL (ref 8.9–10.3)
Chloride: 100 mmol/L (ref 98–111)
Creatinine, Ser: 0.92 mg/dL (ref 0.44–1.00)
GFR, Estimated: >60 mL/min (ref >60)
Glucose, Bld: 143 mg/dL — ABNORMAL HIGH (ref 70–99)
Potassium: 3.4 mmol/L — ABNORMAL LOW (ref 3.5–5.1)
Sodium: 138 mmol/L (ref 135–145)

## 2022-11-29 LAB — MAGNESIUM: Magnesium: 1.7 mg/dL (ref 1.7–2.4)

## 2022-11-29 MED ORDER — POTASSIUM CHLORIDE CRYS ER 20 MEQ PO TBCR
40.0000 meq | EXTENDED_RELEASE_TABLET | Freq: Once | ORAL | Status: AC
  Administered 2022-11-29: 40 meq via ORAL
  Filled 2022-11-29: qty 2

## 2022-11-29 NOTE — ED Triage Notes (Signed)
Pt states she was having "chest discomfort," seen at PCP & diagnosed w bronchitis. During EKG, PVCs were noted- PCP advised further eval. Pt reports typical URI symptoms, "some flutters & I'm very fatigued." Denies N/V, headache, additional symptoms.

## 2022-11-29 NOTE — ED Provider Notes (Signed)
[place] EMERGENCY DEPARTMENT AT [facility] Provider Note   CSN: [id number] Arrival date & time: 11/29/22  2038     History  Chief Complaint  Patient presents with   Abnormal ECG    Tracey Reed is a 52 y.o. female.  Patient is a 52 year old female with a past medical history of hypertension and diabetes presenting to the emergency department with heart palpitations and PVC on urgent care EKG.  The patient states that she was seen at urgent care earlier today for her cough and shortness of breath and was diagnosed with bronchitis.  She states that during her exam her provider noticed that she had an irregular heartbeat and had an EKG that showed PVCs and she was recommended to come to the emergency department.  Patient states that she does intermittently feel palpitations.  She denies any chest pain.  She denies any recent nausea, vomiting or diarrhea.  The history is provided by the patient and the spouse.       Home Medications Prior to Admission medications   Medication Sig Start Date End Date Taking? Authorizing Provider  amphetamine-dextroamphetamine (ADDERALL XR) 20 MG 24 hr capsule Take 1 capsule (20 mg total) by mouth every morning. 01/30/18   Sherren Mocha, MD  atenolol (TENORMIN) 25 MG tablet Take 1 tablet (25 mg total) by mouth daily. 05/31/18   Benjiman Core D, PA-C  blood glucose meter kit and supplies KIT Dispense based on patient and insurance preference. Use up to four times daily as directed. E11.65. Increased testing frequency hyperglycemia 01/30/18   Sherren Mocha, MD  citalopram (CELEXA) 20 MG tablet Take 1 tablet (20 mg total) by mouth daily. 05/31/18   Benjiman Core D, PA-C  clonazePAM (KLONOPIN) 0.5 MG tablet Take 1 tablet (0.5 mg total) by mouth 2 (two) times daily as needed. 05/31/18   Benjiman Core D, PA-C  cyclobenzaprine (FLEXERIL) 10 MG tablet Take 1 tablet (10 mg total) by mouth 3 (three) times daily as needed. 05/31/18    Benjiman Core D, PA-C  glucose blood test strip Use as instructed UP TO 4 TIMES A DAY. Dx: Increased testing frequency hyperglycemia, HTN 01/30/18   Sherren Mocha, MD  hydrOXYzine (ATARAX/VISTARIL) 25 MG tablet Take 1-2 tablets (25-50 mg total) by mouth every 4 (four) hours as needed for itching (urticaria). For itching and hives 05/31/18   Benjiman Core D, PA-C  ibuprofen (ADVIL,MOTRIN) 800 MG tablet Take 1 tablet (800 mg total) by mouth every 8 (eight) hours as needed. 05/31/18   Benjiman Core D, PA-C  levocetirizine (XYZAL) 5 MG tablet Take 1 tablet (5 mg total) by mouth every evening. 01/30/18   Sherren Mocha, MD  linaclotide [facility]) 290 MCG CAPS capsule Take 1 capsule (290 mcg total) by mouth daily before breakfast. 05/31/18   Magdalene River, PA-C  metFORMIN (GLUCOPHAGE-XR) 500 MG 24 hr tablet Take 1 tablet (500 mg total) by mouth daily with breakfast. 05/31/18   Benjiman Core D, PA-C  montelukast (SINGULAIR) 10 MG tablet Take 1 tablet (10 mg total) by mouth at bedtime. 01/30/18   Sherren Mocha, MD  triamterene-hydrochlorothiazide (MAXZIDE-25) 37.5-25 MG tablet Take 1 tablet by mouth daily. 12/03/18   Janeece Agee, NP  TRUEPLUS LANCETS 30G MISC USE 4 TIMES DAILY 11/26/15   Elvina Sidle, MD      Allergies    Clindamycin/lincomycin    Review of Systems   Review of Systems  Physical Exam Updated Vital Signs BP (!) 158/79 (  BP Location: Right Arm)   Pulse 66   Temp (!) 97.5 F (36.4 C)   Resp (!) 24   SpO2 100%  Physical Exam Vitals and nursing note reviewed.  Constitutional:      General: She is not in acute distress.    Appearance: Normal appearance.  HENT:     Head: Normocephalic and atraumatic.     Nose: Nose normal.     Mouth/Throat:     Mouth: Mucous membranes are moist.     Pharynx: Oropharynx is clear.  Eyes:     Extraocular Movements: Extraocular movements intact.     Conjunctiva/sclera: Conjunctivae normal.  Cardiovascular:     Rate and Rhythm: Normal  rate and regular rhythm.     Pulses: Normal pulses.     Heart sounds: Normal heart sounds.  Pulmonary:     Effort: Pulmonary effort is normal.     Breath sounds: Normal breath sounds.  Abdominal:     General: Abdomen is flat.     Palpations: Abdomen is soft.     Tenderness: There is no abdominal tenderness.  Musculoskeletal:        General: Normal range of motion.     Cervical back: Normal range of motion and neck supple.  Skin:    General: Skin is warm and dry.  Neurological:     General: No focal deficit present.     Mental Status: She is alert and oriented to person, place, and time.  Psychiatric:        Mood and Affect: Mood normal.        Behavior: Behavior normal.     ED Results / Procedures / Treatments   Labs (all labs ordered are listed, but only abnormal results are displayed) Labs Reviewed  BASIC METABOLIC PANEL - Abnormal; Notable for the following components:      Result Value   Potassium 3.4 (*)    Glucose, Bld 143 (*)    All other components within normal limits  CBC WITH DIFFERENTIAL/PLATELET - Abnormal; Notable for the following components:   WBC 10.9 (*)    Hemoglobin 11.7 (*)    RDW 16.6 (*)    Lymphs Abs 4.8 (*)    All other components within normal limits  MAGNESIUM    EKG EKG Interpretation  Date/Time:  Tuesday November 29 2022 20:51:15 EDT Ventricular Rate:  68 PR Interval:  178 QRS Duration: 88 QT Interval:  404 QTC Calculation: 429 R Axis:   66 Text Interpretation: Normal sinus rhythm Nonspecific T wave abnormality Abnormal ECG When compared with ECG of 21-Dec-1998 10:32, T wave inversion now evident in Inferior leads Nonspecific T wave abnormality now evident in Anterolateral leads Confirmed by Elayne Snare ([id number]) on 11/29/2022 10:04:31 PM  Radiology DG Chest 2 View  Result Date: 11/29/2022 CLINICAL DATA:  Shortness of breath EXAM: CHEST - 2 VIEW COMPARISON:  Chest x-ray 11/25/2017 FINDINGS: The heart size and mediastinal contours  are within normal limits. Both lungs are clear. The visualized skeletal structures are unremarkable. IMPRESSION: No active cardiopulmonary disease. Electronically Signed   By: Darliss Cheney M.D.   On: 11/29/2022 21:08    Procedures Procedures    Medications Ordered in ED Medications  potassium chloride SA (KLOR-CON M) CR tablet 40 mEq (40 mEq Oral Given 11/29/22 2334)    ED Course/ Medical Decision Making/ A&P Clinical Course as of 11/29/22 2339  Tue Nov 29, 2022  2326 Upon reassessment, patient's potassium was mildly low but labs otherwise within  normal range when consistent with her recent bronchitis diagnosis.  She has remained in sinus rhythm on the monitor without significant PVC burden.  She is stable for discharge home with primary care follow-up and was given strict return precaution. [VK]    Clinical Course User Index [VK] Rexford Maus, DO                             Medical Decision Making This patient presents to the ED with chief complaint(s) of palpitations, abnormal EKG with pertinent past medical history of HTN, DM which further complicates the presenting complaint. The complaint involves an extensive differential diagnosis and also carries with it a high risk of complications and morbidity.    The differential diagnosis includes patient PVCs on urgent care EKG which may be secondary to electrolyte abnormality, idiopathic or recent illness, considering additional arrhythmia, anemia, dehydration  Additional history obtained: Additional history obtained from family Records reviewed Primary Care Documents and urgent care EKG  ED Course and Reassessment: On patient's arrival she is well-appearing in no acute distress.  EKG on arrival showed normal sinus rhythm without any current PVCs.  She will be placed on telemetry and closely monitored.  She will labs evaluate electrolytes for possible causes of her PVCs.  Independent labs interpretation:  The following labs  were independently interpreted: mild hypoK otherwise within normal range  Independent visualization of imaging: - I independently visualized the following imaging with scope of interpretation limited to determining acute life threatening conditions related to emergency care: cxr, which revealed no acute disease  Consultation: - Consulted or discussed management/test interpretation w/ external professional: N/A  Consideration for admission or further workup: Patient has no emergent conditions requiring admission or further work-up at this time and is stable for discharge home with primary care follow-up  Social Determinants of health: N/A    Amount and/or Complexity of Data Reviewed Labs: ordered. Radiology: ordered.          Final Clinical Impression(s) / ED Diagnoses Final diagnoses:  PVC's (premature ventricular contractions)    Rx / DC Orders ED Discharge Orders     None         Rexford Maus, DO 11/29/22 2339

## 2022-11-29 NOTE — Discharge Instructions (Signed)
You were seen in the emergency department for your PVCs or extra beats seen on your EKG.  Your workup showed that your potassium was slightly low but otherwise were normal and you had no significant PVCs or abnormal heart rhythms while you are in the ER.  This could be occurring with your recent illness and you can follow-up with your primary doctor to have your symptoms rechecked.  They may need to do further workup if you are having recurrent symptoms.  You should return to the emergency department if you have a significantly fast heart rate with severe shortness of breath, you pass out or if you have any other new or concerning symptoms.

## 2022-11-29 NOTE — ED Notes (Signed)
RN reviewed discharge instructions with pt. Pt verbalized understanding and had no further questions 

## 2023-08-08 ENCOUNTER — Emergency Department (HOSPITAL_BASED_OUTPATIENT_CLINIC_OR_DEPARTMENT_OTHER)
Admission: EM | Admit: 2023-08-08 | Discharge: 2023-08-08 | Disposition: A | Discharge status: Home / Self Care | Payer: PRIVATE HEALTH INSURANCE

## 2023-08-08 ENCOUNTER — Encounter (HOSPITAL_BASED_OUTPATIENT_CLINIC_OR_DEPARTMENT_OTHER): Payer: Self-pay | Admitting: Emergency Medicine

## 2023-08-08 ENCOUNTER — Other Ambulatory Visit: Payer: Self-pay

## 2023-08-08 DIAGNOSIS — Z79899 Other long term (current) drug therapy: Secondary | ICD-10-CM | POA: Insufficient documentation

## 2023-08-08 DIAGNOSIS — R5383 Other fatigue: Secondary | ICD-10-CM | POA: Diagnosis present

## 2023-08-08 DIAGNOSIS — I10 Essential (primary) hypertension: Secondary | ICD-10-CM | POA: Insufficient documentation

## 2023-08-08 DIAGNOSIS — R059 Cough, unspecified: Secondary | ICD-10-CM | POA: Insufficient documentation

## 2023-08-08 DIAGNOSIS — R0609 Other forms of dyspnea: Secondary | ICD-10-CM | POA: Insufficient documentation

## 2023-08-08 DIAGNOSIS — Z7984 Long term (current) use of oral hypoglycemic drugs: Secondary | ICD-10-CM | POA: Diagnosis not present

## 2023-08-08 DIAGNOSIS — E1165 Type 2 diabetes mellitus with hyperglycemia: Secondary | ICD-10-CM | POA: Insufficient documentation

## 2023-08-08 DIAGNOSIS — E1310 Other specified diabetes mellitus with ketoacidosis without coma: Secondary | ICD-10-CM | POA: Diagnosis not present

## 2023-08-08 LAB — BASIC METABOLIC PANEL
Anion gap: 12 (ref 5–15)
BUN: 16 mg/dL (ref 6–20)
CO2: 25 mmol/L (ref 22–32)
Calcium: 8.4 mg/dL — ABNORMAL LOW (ref 8.9–10.3)
Chloride: 95 mmol/L — ABNORMAL LOW (ref 98–111)
Creatinine, Ser: 0.95 mg/dL (ref 0.44–1.00)
GFR, Estimated: >60 mL/min (ref >60)
Glucose, Bld: 427 mg/dL — ABNORMAL HIGH (ref 70–99)
Potassium: 3.7 mmol/L (ref 3.5–5.1)
Sodium: 132 mmol/L — ABNORMAL LOW (ref 135–145)

## 2023-08-08 LAB — CBC WITH DIFFERENTIAL/PLATELET
Abs Immature Granulocytes: 0.01 10*3/uL (ref 0.00–0.07)
Basophils Absolute: 0 10*3/uL (ref 0.0–0.1)
Basophils Relative: 1 %
Eosinophils Absolute: 0.1 10*3/uL (ref 0.0–0.5)
Eosinophils Relative: 1 %
HCT: 44 % (ref 36.0–46.0)
Hemoglobin: 14.9 g/dL (ref 12.0–15.0)
Immature Granulocytes: 0 %
Lymphocytes Relative: 48 %
Lymphs Abs: 4 10*3/uL (ref 0.7–4.0)
MCH: 26 pg (ref 26.0–34.0)
MCHC: 33.9 g/dL (ref 30.0–36.0)
MCV: 76.9 fL — ABNORMAL LOW (ref 80.0–100.0)
Monocytes Absolute: 0.5 10*3/uL (ref 0.1–1.0)
Monocytes Relative: 6 %
Neutro Abs: 3.6 10*3/uL (ref 1.7–7.7)
Neutrophils Relative %: 44 %
Platelets: 497 10*3/uL — ABNORMAL HIGH (ref 150–400)
RBC: 5.72 MIL/uL — ABNORMAL HIGH (ref 3.87–5.11)
RDW: 15.2 % (ref 11.5–15.5)
WBC: 8.2 10*3/uL (ref 4.0–10.5)
nRBC: 0 % (ref 0.0–0.2)

## 2023-08-08 LAB — URINALYSIS, ROUTINE W REFLEX MICROSCOPIC
Bacteria, UA: NONE SEEN
Bilirubin Urine: NEGATIVE
Glucose, UA: >1000 mg/dL — AB
Hgb urine dipstick: NEGATIVE
Ketones, ur: 15 mg/dL — AB
Leukocytes,Ua: NEGATIVE
Nitrite: NEGATIVE
Protein, ur: NEGATIVE mg/dL
Specific Gravity, Urine: 1.034 — ABNORMAL HIGH (ref 1.005–1.030)
pH: 5 (ref 5.0–8.0)

## 2023-08-08 LAB — PREGNANCY, URINE: Preg Test, Ur: NEGATIVE

## 2023-08-08 LAB — I-STAT VENOUS BLOOD GAS, ED
Acid-Base Excess: 3 mmol/L — ABNORMAL HIGH (ref 0.0–2.0)
Bicarbonate: 28.1 mmol/L — ABNORMAL HIGH (ref 20.0–28.0)
Calcium, Ion: 1.14 mmol/L — ABNORMAL LOW (ref 1.15–1.40)
HCT: 41 % (ref 36.0–46.0)
Hemoglobin: 13.9 g/dL (ref 12.0–15.0)
O2 Saturation: 49 %
Patient temperature: 97.8
Potassium: 3.9 mmol/L (ref 3.5–5.1)
Sodium: 131 mmol/L — ABNORMAL LOW (ref 135–145)
TCO2: 29 mmol/L (ref 22–32)
pCO2, Ven: 44.1 mm[Hg] (ref 44–60)
pH, Ven: 7.411 (ref 7.25–7.43)
pO2, Ven: 26 mm[Hg] — CL (ref 32–45)

## 2023-08-08 LAB — COMPREHENSIVE METABOLIC PANEL
ALT: 18 U/L (ref 0–44)
AST: 23 U/L (ref 15–41)
Albumin: 4.3 g/dL (ref 3.5–5.0)
Alkaline Phosphatase: 90 U/L (ref 38–126)
Anion gap: 17 — ABNORMAL HIGH (ref 5–15)
BUN: 18 mg/dL (ref 6–20)
CO2: 23 mmol/L (ref 22–32)
Calcium: 10 mg/dL (ref 8.9–10.3)
Chloride: 89 mmol/L — ABNORMAL LOW (ref 98–111)
Creatinine, Ser: 1.24 mg/dL — ABNORMAL HIGH (ref 0.44–1.00)
GFR, Estimated: 52 mL/min — ABNORMAL LOW (ref >60)
Glucose, Bld: 656 mg/dL (ref 70–99)
Potassium: 4.2 mmol/L (ref 3.5–5.1)
Sodium: 129 mmol/L — ABNORMAL LOW (ref 135–145)
Total Bilirubin: 0.7 mg/dL (ref 0.0–1.2)
Total Protein: 8.8 g/dL — ABNORMAL HIGH (ref 6.5–8.1)

## 2023-08-08 LAB — CBG MONITORING, ED
Glucose-Capillary: >600 mg/dL (ref 70–99)
Glucose-Capillary: 383 mg/dL — ABNORMAL HIGH (ref 70–99)

## 2023-08-08 LAB — LIPASE, BLOOD: Lipase: 46 U/L (ref 11–51)

## 2023-08-08 MED ORDER — SODIUM CHLORIDE 0.9 % IV BOLUS
1000.0000 mL | Freq: Once | INTRAVENOUS | Status: AC
  Administered 2023-08-08: 1000 mL via INTRAVENOUS

## 2023-08-08 MED ORDER — INSULIN ASPART 100 UNIT/ML IJ SOLN
10.0000 [IU] | Freq: Once | INTRAMUSCULAR | Status: AC
  Administered 2023-08-08: 10 [IU] via SUBCUTANEOUS

## 2023-08-08 MED ORDER — METFORMIN HCL 500 MG PO TABS: 500.0000 mg | ORAL_TABLET | Freq: Two times a day (BID) | ORAL | 0 refills | Status: AC

## 2023-08-08 MED ORDER — ATENOLOL 25 MG PO TABS: 25.0000 mg | ORAL_TABLET | Freq: Every day | ORAL | 0 refills | Status: AC

## 2023-08-08 MED ORDER — METFORMIN HCL 500 MG PO TABS
500.0000 mg | ORAL_TABLET | Freq: Once | ORAL | Status: AC
  Administered 2023-08-08: 500 mg via ORAL
  Filled 2023-08-08: qty 1

## 2023-08-08 NOTE — ED Notes (Signed)
 BMP redrawn and sent to lab

## 2023-08-08 NOTE — ED Provider Notes (Signed)
 [place] EMERGENCY DEPARTMENT AT [facility] Provider Note   CSN: [id number] Arrival date & time: 08/08/23  1531     History  Chief Complaint  Patient presents with   Hyperglycemia    Tracey Reed is a 53 y.o. female.  53 year old female with past medical history of diabetes and hypertension presenting to the emergency department today with fatigue, dyspnea on exertion, and productive cough.  The patient states the symptoms have been going on now for close to 1 month now.  She states that she was feeling unwell in early December and tested positive for COVID.  She was around her daughter who had COVID in the middle of the month and was feeling unwell afterwards with a cough and tested positive again.  She states that she is having some dyspnea with exertion.  She is reporting a productive cough with yellow sputum.  She denies any history of asthma or COPD in the past.  She denies any associated chest pain but is reporting the shortness of breath.  She went to urgent care earlier today and her glucose was noted to be significantly elevated.  She was subsequently sent to the emergency department at time for further evaluation.  She states that she has been out of her blood pressure medications as well as her diabetes medications for the past 2 months.        Home Medications Prior to Admission medications   Medication Sig Start Date End Date Taking? Authorizing Provider  metFORMIN  (GLUCOPHAGE ) 500 MG tablet Take 1 tablet (500 mg total) by mouth 2 (two) times daily with a meal. 08/08/23  Yes Ula Prentice SAUNDERS, MD  amphetamine -dextroamphetamine (ADDERALL XR) 20 MG 24 hr capsule Take 1 capsule (20 mg total) by mouth every morning. 01/30/18   Loreli Elyn SAILOR, MD  atenolol  (TENORMIN ) 25 MG tablet Take 1 tablet (25 mg total) by mouth daily. 08/08/23   Ula Prentice SAUNDERS, MD  blood glucose meter kit and supplies KIT Dispense based on patient and insurance preference. Use up to four times  daily as directed. E11.65. Increased testing frequency hyperglycemia 01/30/18   Loreli Elyn SAILOR, MD  citalopram  (CELEXA ) 20 MG tablet Take 1 tablet (20 mg total) by mouth daily. 05/31/18   Wiseman, Brittany D, PA-C  clonazePAM  (KLONOPIN ) 0.5 MG tablet Take 1 tablet (0.5 mg total) by mouth 2 (two) times daily as needed. 05/31/18   Wiseman, Brittany D, PA-C  cyclobenzaprine  (FLEXERIL ) 10 MG tablet Take 1 tablet (10 mg total) by mouth 3 (three) times daily as needed. 05/31/18   Starla Grate D, PA-C  glucose blood test strip Use as instructed UP TO 4 TIMES A DAY. Dx: Increased testing frequency hyperglycemia, HTN 01/30/18   Loreli Elyn SAILOR, MD  hydrOXYzine  (ATARAX /VISTARIL ) 25 MG tablet Take 1-2 tablets (25-50 mg total) by mouth every 4 (four) hours as needed for itching (urticaria). For itching and hives 05/31/18   Wiseman, Brittany D, PA-C  ibuprofen  (ADVIL ,MOTRIN ) 800 MG tablet Take 1 tablet (800 mg total) by mouth every 8 (eight) hours as needed. 05/31/18   Wiseman, Brittany D, PA-C  levocetirizine (XYZAL ) 5 MG tablet Take 1 tablet (5 mg total) by mouth every evening. 01/30/18   Loreli Elyn SAILOR, MD  linaclotide  (LINZESS ) 290 MCG CAPS capsule Take 1 capsule (290 mcg total) by mouth daily before breakfast. 05/31/18   Starla Grate D, PA-C  montelukast  (SINGULAIR ) 10 MG tablet Take 1 tablet (10 mg total) by mouth at bedtime. 01/30/18  Loreli Elyn SAILOR, MD  triamterene -hydrochlorothiazide  (MAXZIDE-25) 37.5-25 MG tablet Take 1 tablet by mouth daily. 12/03/18   Kip Ade, NP  TRUEPLUS LANCETS 30G MISC USE 4 TIMES DAILY 11/26/15   Mario Million, MD      Allergies    Clindamycin/lincomycin    Review of Systems   Review of Systems  Constitutional:  Positive for fatigue.  Respiratory:  Positive for cough and shortness of breath.   All other systems reviewed and are negative.   Physical Exam Updated Vital Signs BP (!) 111/54   Pulse 84   Temp 98.2 F (36.8 C) (Oral)   Resp 18   Ht 5' 5 (1.651 m)   Wt  117.9 kg   SpO2 95%   BMI 43.27 kg/m  Physical Exam Vitals and nursing note reviewed.   Gen: NAD Eyes: PERRL, EOMI HEENT: no oropharyngeal swelling Neck: trachea midline Resp: clear to auscultation bilaterally Card: RRR, no murmurs, rubs, or gallops Abd: nontender, nondistended Extremities: no calf tenderness, no edema Vascular: 2+ radial pulses bilaterally, 2+ DP pulses bilaterally Neuro: no focal deficits Skin: no rashes Psyc: acting appropriately   ED Results / Procedures / Treatments   Labs (all labs ordered are listed, but only abnormal results are displayed) Labs Reviewed  CBC WITH DIFFERENTIAL/PLATELET - Abnormal; Notable for the following components:      Result Value   RBC 5.72 (*)    MCV 76.9 (*)    Platelets 497 (*)    All other components within normal limits  COMPREHENSIVE METABOLIC PANEL - Abnormal; Notable for the following components:   Sodium 129 (*)    Chloride 89 (*)    Glucose, Bld 656 (*)    Creatinine, Ser 1.24 (*)    Total Protein 8.8 (*)    GFR, Estimated 52 (*)    Anion gap 17 (*)    All other components within normal limits  URINALYSIS, ROUTINE W REFLEX MICROSCOPIC - Abnormal; Notable for the following components:   Specific Gravity, Urine 1.034 (*)    Glucose, UA >1,000 (*)    Ketones, ur 15 (*)    All other components within normal limits  BASIC METABOLIC PANEL - Abnormal; Notable for the following components:   Sodium 132 (*)    Chloride 95 (*)    Glucose, Bld 427 (*)    Calcium 8.4 (*)    All other components within normal limits  CBG MONITORING, ED - Abnormal; Notable for the following components:   Glucose-Capillary >600 (*)    All other components within normal limits  I-STAT VENOUS BLOOD GAS, ED - Abnormal; Notable for the following components:   pO2, Ven 26 (*)    Bicarbonate 28.1 (*)    Acid-Base Excess 3.0 (*)    Sodium 131 (*)    Calcium, Ion 1.14 (*)    All other components within normal limits  CBG MONITORING, ED -  Abnormal; Notable for the following components:   Glucose-Capillary 383 (*)    All other components within normal limits  LIPASE, BLOOD  PREGNANCY, URINE  BLOOD GAS, VENOUS    EKG EKG Interpretation Date/Time:  Tuesday August 08 2023 16:23:31 EST Ventricular Rate:  61 PR Interval:  173 QRS Duration:  110 QT Interval:  451 QTC Calculation: 455 R Axis:   67  Text Interpretation: Sinus rhythm Multiple ventricular premature complexes Confirmed by Ula Barter [phone]) on 08/08/2023 4:27:36 PM  Radiology No results found.  Procedures Procedures    Medications Ordered in ED  Medications  metFORMIN  (GLUCOPHAGE ) tablet 500 mg (has no administration in time range)  sodium chloride  0.9 % bolus 1,000 mL (0 mLs Intravenous Stopped 08/08/23 1734)  sodium chloride  0.9 % bolus 1,000 mL (0 mLs Intravenous Stopped 08/08/23 1837)  insulin  aspart (novoLOG ) injection 10 Units (10 Units Subcutaneous Given 08/08/23 2114)    ED Course/ Medical Decision Making/ A&P                                 Medical Decision Making 53 year old female with past medical history of diabetes and hypertension presenting to the emergency department today with concern for elevated blood pressure.  I will further evaluate the patient here with basic labs as well as urinalysis to evaluate for DKA.  Will give the patient IV fluids here for her elevated blood glucose and see what her actual blood glucose is on her BMP.  I have reviewed her chart from the urgent care.  She has had an x-ray performed earlier today which was read officially as no acute cardiopulmonary abnormality.  Given her reassuring vital signs and no chest pain suspicion for pulmonary embolism is low at this time.  I will reevaluate for ultimate disposition.  The patient's EKG interpreted by me does show a sinus rhythm with T wave inversions in lead III and V3 with multiple PVCs.  This does appear similar morphology to her previous EKG from April of last  year.  The patient's initial labs are consistent with mild diabetic ketoacidosis.  Venous blood gas does not show any significant acidosis and the patient is compensating.  The patient received additional IV fluids with further improvement in her blood glucose.  She was feeling better on reassessment.  Her anion gap has closed.  The patient is given subcutaneous insulin  and her glucose is downtrending.  She is given metformin  and started back on this here in the emergency department.  She will be discharged with return precautions.  Amount and/or Complexity of Data Reviewed Labs: ordered.  Risk Prescription drug management.           Final Clinical Impression(s) / ED Diagnoses Final diagnoses:  Diabetic ketoacidosis without coma associated with other specified diabetes mellitus (HCC)    Rx / DC Orders ED Discharge Orders          Ordered    atenolol  (TENORMIN ) 25 MG tablet  Daily        08/08/23 2210    metFORMIN  (GLUCOPHAGE ) 500 MG tablet  2 times daily with meals        08/08/23 2210              Ula Prentice SAUNDERS, MD 08/08/23 2211

## 2023-08-08 NOTE — Discharge Instructions (Addendum)
 Your labs have improved on recheck year.  Please take the metformin twice daily and start back on the atenolol.  Please call and schedule follow-up appointment with your primary care doctor.  Return to the ER for worsening symptoms.

## 2023-08-08 NOTE — ED Notes (Signed)
 Spoke with lab about BMP; lab reports BMP in process

## 2023-08-08 NOTE — ED Triage Notes (Addendum)
 Was not feeling well after having covid a while back  and  she continued to mot feel well ,thought it was  still covid she went to [facility]  today and her sugar was out the roof , hasnot  checked her sugar  in months   and has been only taken her meds x 2 in the last 2 months for her sugar and HTN

## 2023-08-09 ENCOUNTER — Other Ambulatory Visit: Payer: Self-pay

## 2023-08-09 ENCOUNTER — Encounter (HOSPITAL_BASED_OUTPATIENT_CLINIC_OR_DEPARTMENT_OTHER): Payer: Self-pay | Admitting: Emergency Medicine

## 2023-08-09 ENCOUNTER — Emergency Department (HOSPITAL_BASED_OUTPATIENT_CLINIC_OR_DEPARTMENT_OTHER)
Admission: EM | Admit: 2023-08-09 | Discharge: 2023-08-09 | Disposition: A | Discharge status: Home / Self Care | Payer: PRIVATE HEALTH INSURANCE | Attending: Emergency Medicine | Admitting: Emergency Medicine

## 2023-08-09 DIAGNOSIS — E1165 Type 2 diabetes mellitus with hyperglycemia: Secondary | ICD-10-CM | POA: Insufficient documentation

## 2023-08-09 DIAGNOSIS — R739 Hyperglycemia, unspecified: Secondary | ICD-10-CM | POA: Diagnosis present

## 2023-08-09 DIAGNOSIS — Z7984 Long term (current) use of oral hypoglycemic drugs: Secondary | ICD-10-CM | POA: Diagnosis not present

## 2023-08-09 LAB — CBC
HCT: 41.2 % (ref 36.0–46.0)
Hemoglobin: 13.9 g/dL (ref 12.0–15.0)
MCH: 26.1 pg (ref 26.0–34.0)
MCHC: 33.7 g/dL (ref 30.0–36.0)
MCV: 77.4 fL — ABNORMAL LOW (ref 80.0–100.0)
Platelets: 481 10*3/uL — ABNORMAL HIGH (ref 150–400)
RBC: 5.32 MIL/uL — ABNORMAL HIGH (ref 3.87–5.11)
RDW: 15.4 % (ref 11.5–15.5)
WBC: 8.6 10*3/uL (ref 4.0–10.5)
nRBC: 0 % (ref 0.0–0.2)

## 2023-08-09 LAB — BASIC METABOLIC PANEL
Anion gap: 10 (ref 5–15)
BUN: 16 mg/dL (ref 6–20)
CO2: 26 mmol/L (ref 22–32)
Calcium: 9.8 mg/dL (ref 8.9–10.3)
Chloride: 91 mmol/L — ABNORMAL LOW (ref 98–111)
Creatinine, Ser: 1.08 mg/dL — ABNORMAL HIGH (ref 0.44–1.00)
GFR, Estimated: >60 mL/min (ref >60)
Glucose, Bld: 556 mg/dL (ref 70–99)
Potassium: 4.1 mmol/L (ref 3.5–5.1)
Sodium: 127 mmol/L — ABNORMAL LOW (ref 135–145)

## 2023-08-09 LAB — CBG MONITORING, ED
Glucose-Capillary: 530 mg/dL (ref 70–99)
Glucose-Capillary: 377 mg/dL — ABNORMAL HIGH (ref 70–99)

## 2023-08-09 LAB — URINALYSIS, ROUTINE W REFLEX MICROSCOPIC
Bilirubin Urine: NEGATIVE
Glucose, UA: >1000 mg/dL — AB
Hgb urine dipstick: NEGATIVE
Ketones, ur: 15 mg/dL — AB
Leukocytes,Ua: NEGATIVE
Nitrite: NEGATIVE
Protein, ur: NEGATIVE mg/dL
Specific Gravity, Urine: 1.037 — ABNORMAL HIGH (ref 1.005–1.030)
pH: 5.5 (ref 5.0–8.0)

## 2023-08-09 LAB — PREGNANCY, URINE: Preg Test, Ur: NEGATIVE

## 2023-08-09 MED ORDER — LACTATED RINGERS IV BOLUS
1000.0000 mL | Freq: Once | INTRAVENOUS | Status: AC
  Administered 2023-08-09: 1000 mL via INTRAVENOUS

## 2023-08-09 MED ORDER — ONDANSETRON HCL 4 MG/2ML IJ SOLN
4.0000 mg | Freq: Once | INTRAMUSCULAR | Status: AC
  Administered 2023-08-09: 4 mg via INTRAVENOUS
  Filled 2023-08-09: qty 2

## 2023-08-09 MED ORDER — ONDANSETRON 4 MG PO TBDP: 4.0000 mg | ORAL_TABLET | Freq: Three times a day (TID) | ORAL | 0 refills | Status: AC | PRN

## 2023-08-09 MED ORDER — INSULIN REGULAR HUMAN 100 UNIT/ML IJ SOLN: 8.0000 [IU] | Freq: Once | INTRAMUSCULAR | Status: DC

## 2023-08-09 MED ORDER — INSULIN ASPART 100 UNIT/ML IJ SOLN
8.0000 [IU] | Freq: Once | INTRAMUSCULAR | Status: AC
  Administered 2023-08-09: 8 [IU] via SUBCUTANEOUS

## 2023-08-09 NOTE — ED Triage Notes (Signed)
 Seen last night for high cbg. Given insulin and metformin. Today report cbg ovewr 500 feeling worse today then yesterday. Nausea, dizziness

## 2023-08-09 NOTE — ED Provider Notes (Signed)
 [place] EMERGENCY DEPARTMENT AT [facility] Provider Note   CSN: [id number] Arrival date & time: 08/09/23  1320     History  Chief Complaint  Patient presents with   Hyperglycemia    Tracey Reed is a 53 y.o. female with past medical history of diabetes, hypertension.  Patient reports she was seen here yesterday for similar thing.  Patient reports this morning when she woke up her blood sugar reports in 500s.  Patient reported she felt slightly lightheaded and nauseous. Reports prior to last few days she has been feeling unwell.  Has been taking metformin  500 mg once a day.  Has not been checking blood sugar because she is busy, went to PCP who found elevated BG thus sent here yesterday.   Denies chest pain, frequent urination, shortness of breath, abdominal pain, vomiting and diarrhea.     Hyperglycemia Associated symptoms: increased thirst   Associated symptoms: no abdominal pain        Home Medications Prior to Admission medications   Medication Sig Start Date End Date Taking? Authorizing Provider  amphetamine -dextroamphetamine (ADDERALL XR) 20 MG 24 hr capsule Take 1 capsule (20 mg total) by mouth every morning. 01/30/18   Loreli Elyn SAILOR, MD  atenolol  (TENORMIN ) 25 MG tablet Take 1 tablet (25 mg total) by mouth daily. 08/08/23   Ula Prentice SAUNDERS, MD  blood glucose meter kit and supplies KIT Dispense based on patient and insurance preference. Use up to four times daily as directed. E11.65. Increased testing frequency hyperglycemia 01/30/18   Loreli Elyn SAILOR, MD  citalopram  (CELEXA ) 20 MG tablet Take 1 tablet (20 mg total) by mouth daily. 05/31/18   Wiseman, Brittany D, PA-C  clonazePAM  (KLONOPIN ) 0.5 MG tablet Take 1 tablet (0.5 mg total) by mouth 2 (two) times daily as needed. 05/31/18   Wiseman, Brittany D, PA-C  cyclobenzaprine  (FLEXERIL ) 10 MG tablet Take 1 tablet (10 mg total) by mouth 3 (three) times daily as needed. 05/31/18   Starla Grate D, PA-C  glucose  blood test strip Use as instructed UP TO 4 TIMES A DAY. Dx: Increased testing frequency hyperglycemia, HTN 01/30/18   Loreli Elyn SAILOR, MD  hydrOXYzine  (ATARAX /VISTARIL ) 25 MG tablet Take 1-2 tablets (25-50 mg total) by mouth every 4 (four) hours as needed for itching (urticaria). For itching and hives 05/31/18   Wiseman, Brittany D, PA-C  ibuprofen  (ADVIL ,MOTRIN ) 800 MG tablet Take 1 tablet (800 mg total) by mouth every 8 (eight) hours as needed. 05/31/18   Wiseman, Brittany D, PA-C  levocetirizine (XYZAL ) 5 MG tablet Take 1 tablet (5 mg total) by mouth every evening. 01/30/18   Loreli Elyn SAILOR, MD  linaclotide  (LINZESS ) 290 MCG CAPS capsule Take 1 capsule (290 mcg total) by mouth daily before breakfast. 05/31/18   Starla Grate BIRCH, PA-C  metFORMIN  (GLUCOPHAGE ) 500 MG tablet Take 1 tablet (500 mg total) by mouth 2 (two) times daily with a meal. 08/08/23   Ula Prentice SAUNDERS, MD  montelukast  (SINGULAIR ) 10 MG tablet Take 1 tablet (10 mg total) by mouth at bedtime. 01/30/18   Loreli Elyn SAILOR, MD  triamterene -hydrochlorothiazide  (MAXZIDE-25) 37.5-25 MG tablet Take 1 tablet by mouth daily. 12/03/18   Kip Ade, NP  TRUEPLUS LANCETS 30G MISC USE 4 TIMES DAILY 11/26/15   Mario Million, MD      Allergies    Clindamycin/lincomycin    Review of Systems   Review of Systems  Gastrointestinal:  Negative for abdominal pain.  Endocrine: Positive for polydipsia.  Physical Exam Updated Vital Signs BP (!) 149/94 (BP Location: Right Arm)   Pulse 68   Temp (!) 97.3 F (36.3 C)   Resp 20   SpO2 100%  Physical Exam  ED Results / Procedures / Treatments   Labs (all labs ordered are listed, but only abnormal results are displayed) Labs Reviewed  CBC - Abnormal; Notable for the following components:      Result Value   RBC 5.32 (*)    MCV 77.4 (*)    Platelets 481 (*)    All other components within normal limits  URINALYSIS, ROUTINE W REFLEX MICROSCOPIC - Abnormal; Notable for the following components:   Color,  Urine COLORLESS (*)    Specific Gravity, Urine 1.037 (*)    Glucose, UA >1,000 (*)    Ketones, ur 15 (*)    Bacteria, UA RARE (*)    All other components within normal limits  CBG MONITORING, ED - Abnormal; Notable for the following components:   Glucose-Capillary 530 (*)    All other components within normal limits  PREGNANCY, URINE  BASIC METABOLIC PANEL    EKG None  Radiology No results found.  Procedures Procedures    Medications Ordered in ED Medications - No data to display  ED Course/ Medical Decision Making/ A&P                                 Medical Decision Making Amount and/or Complexity of Data Reviewed Labs: ordered.  Risk Prescription drug management.   This patient presents to the ED for concern of hyperglycemia, this involves an extensive number of treatment options, and is a complaint that carries with it a high risk of complications and morbidity.  The differential diagnosis includes DKA, HHS, Hyperglycemia, non compliance.    Co morbidities that complicate the patient evaluation  DM   Additional history obtained:  Additional history obtained from 08/08/23   Lab Tests:  I personally interpreted labs.  The pertinent results include:   CBC without leukocytosis, no anemia.  BMP without anion gap.  Sodium 127 however corrected for hyperglycemia.  Glucose was 556.  Urinalysis with negative trace, negative leukocytes.  Does have large amount of glucose.  Recheck CBG 300s   Imaging Studies ordered:  Do not feel pain needs imaging at this time.    Cardiac Monitoring: / EKG:  The patient was maintained on a cardiac monitor.     Consultations Obtained:  None    Problem List / ED Course / Critical interventions / Medication management  Patient reporting to emergency room with hyperglycemia.  Patient was seen here yesterday for similar thing and had elevated anion gap which improved after fluids and insulin .  Patient presenting today with  some nausea and found to have blood sugar elevated this morning as well.  Today no anion, no electrolyte abnormality.  Hemodynamically stable. Appears well.  Blood sugar improved after insulin  and fluids. Tolerating PO intake.  Patient requesting to be discharged home.  Will adjust her metformin  dosing given return precautions. Agrees to PCP follow up.  I ordered medication including NS, insulin   Reevaluation of the patient after these medicines showed that the patient improved I have reviewed the patients home medicines and have made adjustments as needed   Plan  Increase metformin  dose, follow up with PCP in 2-3 days.  Sent zofran  for nausea Return with new or worsening symptoms.  Final Clinical Impression(s) / ED Diagnoses Final diagnoses:  Hyperglycemia    Rx / DC Orders ED Discharge Orders     None         Shermon Warren SAILOR, PA-C 08/09/23 1809    Lenor Hollering, MD 08/14/23 1328

## 2023-08-09 NOTE — Discharge Instructions (Addendum)
 Please take metformin 1000mg  twice a day. I have sent zofran to your pharmacy to take for nausea. Avoid sugary drinks, stay well hydrated with water. Follow up with PCP in 2-3 days. Return with new or worsening symptoms.
